# Patient Record
Sex: Female | Born: 1975 | Hispanic: No | Marital: Married | Smoking: Never smoker
Health system: Southern US, Community
[De-identification: ages and names within clinical notes are randomized; demographics above are authoritative.]

## PROBLEM LIST (undated history)

## (undated) ENCOUNTER — Inpatient Hospital Stay (HOSPITAL_COMMUNITY): Payer: Self-pay

## (undated) DIAGNOSIS — Z789 Other specified health status: Secondary | ICD-10-CM

---

## 2009-09-03 ENCOUNTER — Inpatient Hospital Stay (HOSPITAL_COMMUNITY): Admission: AD | Admit: 2009-09-03 | Discharge: 2009-09-06 | Payer: Self-pay | Admitting: Obstetrics

## 2009-09-15 ENCOUNTER — Observation Stay (HOSPITAL_COMMUNITY): Admission: AD | Admit: 2009-09-15 | Discharge: 2009-09-17 | Payer: Self-pay | Admitting: Obstetrics

## 2009-09-19 ENCOUNTER — Ambulatory Visit: Payer: Self-pay | Admitting: Advanced Practice Midwife

## 2009-09-19 ENCOUNTER — Inpatient Hospital Stay (HOSPITAL_COMMUNITY): Admission: AD | Admit: 2009-09-19 | Discharge: 2009-09-30 | Payer: Self-pay | Admitting: Obstetrics

## 2009-09-27 ENCOUNTER — Encounter (INDEPENDENT_AMBULATORY_CARE_PROVIDER_SITE_OTHER): Payer: Self-pay | Admitting: Obstetrics

## 2010-10-29 LAB — CROSSMATCH: ABO/RH(D): O POS

## 2010-10-29 LAB — COMPREHENSIVE METABOLIC PANEL
Albumin: 2.3 g/dL — ABNORMAL LOW (ref 3.5–5.2)
Alkaline Phosphatase: 126 U/L — ABNORMAL HIGH (ref 39–117)
BUN: 6 mg/dL (ref 6–23)
Chloride: 106 mEq/L (ref 96–112)
Creatinine, Ser: 0.63 mg/dL (ref 0.4–1.2)
GFR calc Af Amer: >60 mL/min (ref >60)
GFR calc non Af Amer: >60 mL/min (ref >60)
Total Protein: 5.5 g/dL — ABNORMAL LOW (ref 6.0–8.3)

## 2010-10-29 LAB — CBC
MCHC: 33.3 g/dL (ref 30.0–36.0)
RDW: 13.2 % (ref 11.5–15.5)

## 2010-10-29 LAB — FIBRINOGEN: Fibrinogen: 412 mg/dL (ref 204–475)

## 2010-10-29 LAB — D-DIMER, QUANTITATIVE: D-Dimer, Quant: 1.12 ug/mL-FEU — ABNORMAL HIGH (ref 0.00–0.48)

## 2010-10-29 LAB — APTT: aPTT: 30 seconds (ref 24–37)

## 2010-11-01 LAB — CBC
HCT: 21.4 % — ABNORMAL LOW (ref 36.0–46.0)
HCT: 28 % — ABNORMAL LOW (ref 36.0–46.0)
HCT: 29.8 % — ABNORMAL LOW (ref 36.0–46.0)
HCT: 30.5 % — ABNORMAL LOW (ref 36.0–46.0)
Hemoglobin: 10.2 g/dL — ABNORMAL LOW (ref 12.0–15.0)
Hemoglobin: 10.7 g/dL — ABNORMAL LOW (ref 12.0–15.0)
Hemoglobin: 7.2 g/dL — ABNORMAL LOW (ref 12.0–15.0)
Hemoglobin: 9.2 g/dL — ABNORMAL LOW (ref 12.0–15.0)
Hemoglobin: 9.6 g/dL — ABNORMAL LOW (ref 12.0–15.0)
MCHC: 33 g/dL (ref 30.0–36.0)
MCHC: 33 g/dL (ref 30.0–36.0)
MCHC: 33.5 g/dL (ref 30.0–36.0)
MCHC: 33.5 g/dL (ref 30.0–36.0)
MCHC: 33.6 g/dL (ref 30.0–36.0)
MCV: 96.3 fL (ref 78.0–100.0)
MCV: 96.9 fL (ref 78.0–100.0)
MCV: 99.3 fL (ref 78.0–100.0)
Platelets: 215 10*3/uL (ref 150–400)
Platelets: 236 10*3/uL (ref 150–400)
Platelets: 276 10*3/uL (ref 150–400)
RBC: 2.19 MIL/uL — ABNORMAL LOW (ref 3.87–5.11)
RBC: 2.82 MIL/uL — ABNORMAL LOW (ref 3.87–5.11)
RBC: 3.17 MIL/uL — ABNORMAL LOW (ref 3.87–5.11)
RBC: 3.3 MIL/uL — ABNORMAL LOW (ref 3.87–5.11)
RDW: 13.7 % (ref 11.5–15.5)
RDW: 15.4 % (ref 11.5–15.5)
RDW: 15.5 % (ref 11.5–15.5)
RDW: 15.8 % — ABNORMAL HIGH (ref 11.5–15.5)
RDW: 15.8 % — ABNORMAL HIGH (ref 11.5–15.5)
RDW: 15.8 % — ABNORMAL HIGH (ref 11.5–15.5)
WBC: 11.1 10*3/uL — ABNORMAL HIGH (ref 4.0–10.5)

## 2010-11-01 LAB — STREP B DNA PROBE: Strep Group B Ag: NEGATIVE

## 2010-11-01 LAB — APTT: aPTT: 28 seconds (ref 24–37)

## 2010-11-01 LAB — CROSSMATCH: Antibody Screen: NEGATIVE

## 2010-11-01 LAB — RAPID HIV SCREEN (WH-MAU): Rapid HIV Screen: NONREACTIVE

## 2010-11-01 LAB — TYPE AND SCREEN

## 2010-11-01 LAB — PROTIME-INR: INR: 1.02 (ref 0.00–1.49)

## 2010-11-21 ENCOUNTER — Other Ambulatory Visit (HOSPITAL_COMMUNITY): Payer: Self-pay | Admitting: Obstetrics

## 2010-11-21 ENCOUNTER — Ambulatory Visit (HOSPITAL_COMMUNITY)
Admission: RE | Admit: 2010-11-21 | Discharge: 2010-11-21 | Disposition: A | Discharge status: Home / Self Care | Payer: 59 | Source: Ambulatory Visit | Attending: Obstetrics | Admitting: Obstetrics

## 2010-11-21 DIAGNOSIS — R7611 Nonspecific reaction to tuberculin skin test without active tuberculosis: Secondary | ICD-10-CM | POA: Insufficient documentation

## 2011-11-30 ENCOUNTER — Ambulatory Visit (INDEPENDENT_AMBULATORY_CARE_PROVIDER_SITE_OTHER): Payer: 59 | Admitting: Family Medicine

## 2011-11-30 VITALS — BP 120/78 | HR 79 | Temp 98.2°F | Resp 16 | Ht 62.75 in | Wt 159.6 lb

## 2011-11-30 DIAGNOSIS — J302 Other seasonal allergic rhinitis: Secondary | ICD-10-CM

## 2011-11-30 DIAGNOSIS — J309 Allergic rhinitis, unspecified: Secondary | ICD-10-CM

## 2011-11-30 MED ORDER — MOMETASONE FUROATE 50 MCG/ACT NA SUSP: 2.0000 | Freq: Every day | NASAL | Status: DC

## 2011-11-30 MED ORDER — KETOTIFEN FUMARATE 0.025 % OP SOLN: 1.0000 [drp] | Freq: Two times a day (BID) | OPHTHALMIC | Status: AC

## 2011-11-30 NOTE — Progress Notes (Signed)
  [facility]:  Office Visit  Chief Complaint:  Chief Complaint  Patient presents with  . Sore Throat    itchy watery eyes sinus congestion x 3 days  . Cough    HPI: Katherine Day is a 35 y.o. female who complains of  3 day h/o of worsening allergy sxs. Red eyes, itchy eyes, sinus congestion. No fevers, chills. Tried OTc meds without relief.   History reviewed. No pertinent past medical history. Past Surgical History  Procedure Date  . Cesarean section    History   Social History  . Marital Status: Married    Spouse Name: N/A    Number of Children: N/A  . Years of Education: N/A   Social History Main Topics  . Smoking status: Never Smoker   . Smokeless tobacco: None  . Alcohol Use: No  . Drug Use: No  . Sexually Active: None   Other Topics Concern  . None   Social History Narrative  . None   No family history on file. No Known Allergies Prior to Admission medications   Not on File     ROS: The patient denies fevers, chills, night sweats, unintentional weight loss, chest pain, palpitations, wheezing, dyspnea on exertion, nausea, vomiting, abdominal pain, dysuria, hematuria, melena, numbness, weakness, or tingling. + itchy eyes, sinus congestion  All other systems have been reviewed and were otherwise negative with the exception of those mentioned in the HPI and as above.    PHYSICAL EXAM: Filed Vitals:   11/30/11 1024  BP: 120/78  Pulse: 79  Temp: 98.2 F (36.8 C)  Resp: 16   Filed Vitals:   11/30/11 1024  Height: 5' 2.75" (1.594 m)  Weight: 159 lb 9.6 oz (72.394 kg)   Body mass index is 28.50 kg/(m^2).  General: Alert, no acute distress HEENT:  Normocephalic, atraumatic, oropharynx patent. Eyes red, TM nl, throat nl. NO sinus tenderness. NO evidence of cellulitis. EOMI, PERRLA, fundoscopic exam nl.  Cardiovascular:  Regular rate and rhythm, no rubs murmurs or gallops.  No Carotid bruits, radial pulse intact. No pedal edema.    Respiratory: Clear to auscultation bilaterally.  No wheezes, rales, or rhonchi.  No cyanosis, no use of accessory musculature GI: No organomegaly, abdomen is soft and non-tender, positive bowel sounds.  No masses. Skin: No rashes. Neurologic: Facial musculature symmetric. Psychiatric: Patient is appropriate throughout our interaction. Lymphatic: No cervical lymphadenopathy Musculoskeletal: Gait intact.   LABS:    EKG/XRAY:   Primary read interpreted by Dr. Conley Rolls at [facility].   ASSESSMENT/PLAN: Encounter Diagnoses  Name Primary?  . Seasonal allergies Yes  . Allergic rhinitis    Flonase Anithistamine/Mast cell eye drops x 1 week C/w OTC Loratidine    Katherine Brisbon PHUONG, DO 11/30/2011 11:08 AM

## 2011-12-06 ENCOUNTER — Ambulatory Visit (INDEPENDENT_AMBULATORY_CARE_PROVIDER_SITE_OTHER): Payer: 59 | Admitting: Family Medicine

## 2011-12-06 DIAGNOSIS — H1010 Acute atopic conjunctivitis, unspecified eye: Secondary | ICD-10-CM

## 2011-12-06 DIAGNOSIS — J309 Allergic rhinitis, unspecified: Secondary | ICD-10-CM

## 2011-12-06 DIAGNOSIS — H1045 Other chronic allergic conjunctivitis: Secondary | ICD-10-CM

## 2011-12-06 MED ORDER — AZELASTINE HCL 0.1 % NA SOLN: 1.0000 | Freq: Two times a day (BID) | NASAL | Status: DC

## 2011-12-06 MED ORDER — METHYLPREDNISOLONE ACETATE 80 MG/ML IJ SUSP
80.0000 mg | Freq: Once | INTRAMUSCULAR | Status: AC
  Administered 2011-12-06: 80 mg via INTRAMUSCULAR

## 2011-12-06 NOTE — Progress Notes (Signed)
  Subjective:    Patient ID: Katherine Day, female    DOB: 01-26-1976, 36 y.o.   MRN: [id number]  HPI  Patient presents with persistent allergy symptoms; see OV 4/20  Extremely itchy eyes; rubbing eyes Nasal discharge mucus is purulent mixed with blood Denies coughing; some chest tightness  Taking claritin.  Review of Systems     Objective:   Physical Exam  HENT:  Right Ear: Tympanic membrane is retracted.  Left Ear: Tympanic membrane is retracted.  Nose: Mucosal edema (very swollen inflamed nasal turbinates) present.  Eyes: Right conjunctiva is injected. Left conjunctiva is injected.  Cardiovascular: Normal rate and regular rhythm.   Pulmonary/Chest: Wheezes: course BS.        Assessment & Plan:   1. Allergic rhinitis  methylPREDNISolone acetate (DEPO-MEDROL) injection 80 mg, azelastine (ASTELIN) 137 MCG/SPRAY nasal spray  2. Conjunctivitis, allergic  methylPREDNISolone acetate (DEPO-MEDROL) injection 80 mg   OTC [place] opthalmic drops BID Continue Claritin and Nasonex

## 2012-06-08 ENCOUNTER — Ambulatory Visit (INDEPENDENT_AMBULATORY_CARE_PROVIDER_SITE_OTHER): Payer: 59 | Admitting: Emergency Medicine

## 2012-06-08 VITALS — BP 113/75 | HR 82 | Temp 98.3°F | Resp 16 | Ht 63.0 in | Wt 165.0 lb

## 2012-06-08 DIAGNOSIS — J209 Acute bronchitis, unspecified: Secondary | ICD-10-CM

## 2012-06-08 DIAGNOSIS — J018 Other acute sinusitis: Secondary | ICD-10-CM

## 2012-06-08 DIAGNOSIS — Z23 Encounter for immunization: Secondary | ICD-10-CM

## 2012-06-08 MED ORDER — HYDROCOD POLST-CHLORPHEN POLST 10-8 MG/5ML PO LQCR: 5.0000 mL | Freq: Two times a day (BID) | ORAL | Status: DC | PRN

## 2012-06-08 MED ORDER — AMOXICILLIN-POT CLAVULANATE 875-125 MG PO TABS: 1.0000 | ORAL_TABLET | Freq: Two times a day (BID) | ORAL | Status: DC

## 2012-06-08 MED ORDER — PSEUDOEPHEDRINE-GUAIFENESIN ER 60-600 MG PO TB12: 1.0000 | ORAL_TABLET | Freq: Two times a day (BID) | ORAL | Status: DC

## 2012-06-08 NOTE — Progress Notes (Signed)
[facility] [address], [place] [address] [phone]  Date:  06/08/2012   Name:  Katherine Day   DOB:  1975-12-31   MRN:  [id number]  PCP:  No primary provider on file.    Chief Complaint: URI   History of Present Illness:  Katherine Day is a 36 y.o. very pleasant female patient who presents with the following:  Ill 3 days ago with nasal congestion and drainage.  Purulent post nasal drainage. Cough productive purulent sputum.  Headache, pressure in maxillary sinuses and malaise and myalgias.  No nausea or vomiting. No stool change.  No wheezing or shortness of breath.  No improvement with OTC medication.   There is no problem list on file for this patient.   No past medical history on file.  Past Surgical History  Procedure Date  . Cesarean section     History  Substance Use Topics  . Smoking status: Never Smoker   . Smokeless tobacco: Not on file  . Alcohol Use: No    No family history on file.  No Known Allergies  Medication list has been reviewed and updated.  No current outpatient prescriptions on file prior to visit.    Review of Systems:  As per HPI, otherwise negative.    Physical Examination: Filed Vitals:   06/08/12 1037  BP: 113/75  Pulse: 82  Temp: 98.3 F (36.8 C)  Resp: 16   Filed Vitals:   06/08/12 1037  Height: 5\' 3"  (1.6 m)  Weight: 165 lb (74.844 kg)   Body mass index is 29.23 kg/(m^2). Ideal Body Weight: Weight in (lb) to have BMI = 25: 140.8   GEN: WDWN, NAD, Non-toxic, A & O x 3  No rash or sepsis.  No icterus or shortness of breath. HEENT: Atraumatic, Normocephalic. Neck supple. No masses, No LAD.  Oropharynx negative Ears and Nose: No external deformity.  TM negative.   CV: RRR, No M/G/R. No JVD. No thrill. No extra heart sounds. PULM: CTA B, no wheezes, crackles, rhonchi. No retractions. No resp. distress. No accessory muscle use. ABD: S, NT, ND, +BS. No rebound. No HSM. EXTR: No  c/c/e NEURO Normal gait.  PSYCH: Normally interactive. Conversant. Not depressed or anxious appearing.  Calm demeanor.    Assessment and Plan: Sinusitis Bronchitis augmentin mucinex d tussionex Follow up as needed  [name], MD

## 2012-06-15 NOTE — Progress Notes (Signed)
Reviewed and agree.

## 2012-07-03 ENCOUNTER — Ambulatory Visit (HOSPITAL_COMMUNITY)
Admission: RE | Admit: 2012-07-03 | Discharge: 2012-07-03 | Disposition: A | Discharge status: Home / Self Care | Payer: 59 | Source: Ambulatory Visit | Attending: Obstetrics | Admitting: Obstetrics

## 2012-07-03 ENCOUNTER — Other Ambulatory Visit (HOSPITAL_COMMUNITY): Payer: Self-pay | Admitting: Obstetrics

## 2012-07-03 DIAGNOSIS — Z9289 Personal history of other medical treatment: Secondary | ICD-10-CM

## 2012-07-03 DIAGNOSIS — R7611 Nonspecific reaction to tuberculin skin test without active tuberculosis: Secondary | ICD-10-CM | POA: Insufficient documentation

## 2012-07-14 ENCOUNTER — Other Ambulatory Visit: Payer: Self-pay | Admitting: Physician Assistant

## 2012-07-14 ENCOUNTER — Ambulatory Visit: Payer: 59

## 2012-07-14 ENCOUNTER — Telehealth: Payer: Self-pay | Admitting: Physician Assistant

## 2012-07-14 ENCOUNTER — Ambulatory Visit (INDEPENDENT_AMBULATORY_CARE_PROVIDER_SITE_OTHER): Payer: 59 | Admitting: Internal Medicine

## 2012-07-14 VITALS — BP 148/90 | HR 98 | Temp 99.1°F | Resp 18 | Ht 63.0 in | Wt 163.0 lb

## 2012-07-14 DIAGNOSIS — R070 Pain in throat: Secondary | ICD-10-CM

## 2012-07-14 DIAGNOSIS — E041 Nontoxic single thyroid nodule: Secondary | ICD-10-CM

## 2012-07-14 NOTE — Telephone Encounter (Signed)
Please call pt and let her know that the radiologist who over read her x-ray could not exclude a foreign body.  Given this information, we could go ahead and proceed with an ENT evaluation if she would like.  Or, we can continue to watch and wait, it is up to her.

## 2012-07-14 NOTE — Telephone Encounter (Signed)
Left message for patient to call back about the xray.

## 2012-07-14 NOTE — Patient Instructions (Addendum)
We were not able to see any foreign body on the x-ray or by looking in your throat.  It is possible that the bone scraped your throat but has already passed through.  Let us know how you are doing in 24-48 hours.  If you are not improving, we will refer you to ENT for further evaluation.  You may continue using Aleve for pain relief.  Eat soft foods and drink plenty of water.  If you are worsening - having difficulty swallowing, drooling, having difficulty breathing - let us know right away.   I am putting in an order for you to have a thyroid ultrasound to evaluate the nodule that we found.  You will get a phone call to schedule this.

## 2012-07-14 NOTE — Progress Notes (Signed)
  Subjective:    Patient ID: Katherine Day, female    DOB: 12/17/1975, 36 y.o.   MRN: [id number]  HPI   Katherine Day is a 36 yr old female complaining of throat pain.  States that she feels like there is something stuck in her throat.  She noticed this sensation yesterday.  It feels sharp "like a small bone".  States she did eat tilapia that had bones in it yesterday.  She is able to swallow without difficulty, just pain.  No drooling.  No difficulty breathing.  This has never happened before.  She has not eaten anything yet today.  This morning she took Aleve for pain relief and amoxicillin that she had left over from the last time she was sick.  The Aleve did relieve her pain.  She denies any associated symptoms   Review of Systems  Constitutional: Negative for fever and chills.  HENT: Positive for sore throat. Negative for ear pain, congestion, rhinorrhea, sneezing, drooling, trouble swallowing, voice change, postnasal drip and sinus pressure.   Respiratory: Negative.   Cardiovascular: Negative.   Gastrointestinal: Negative.   Musculoskeletal: Negative.   Skin: Negative.   Neurological: Negative.        Objective:   Physical Exam  Vitals reviewed. Constitutional: She is oriented to person, place, and time. She appears well-developed and well-nourished. No distress.  HENT:  Head: Normocephalic and atraumatic.  Mouth/Throat: Uvula is midline, oropharynx is clear and moist and mucous membranes are normal.       No foreign body visualized  Neck: Neck supple. No tracheal deviation present. Thyromegaly (nodule, right side) present.  Cardiovascular: Normal rate, regular rhythm and normal heart sounds.  Exam reveals no gallop and no friction rub.   No murmur heard. Pulmonary/Chest: Effort normal and breath sounds normal. No stridor. She has no wheezes. She has no rales.  Lymphadenopathy:    She has no cervical adenopathy.  Neurological: She is alert and oriented to person, place, and time.   Skin: Skin is warm and dry.  Psychiatric: She has a normal mood and affect. Her behavior is normal.      Filed Vitals:   07/14/12 0755  BP: 148/90  Pulse: 98  Temp: 99.1 F (37.3 C)  Resp: 18     [facility] reading (PRIMARY) by  Dr. Perrin Maltese - no foreign body visualized       Assessment & Plan:   1. Throat pain  DG Cervical Spine 2-3 Views  2. Thyroid nodule  US Soft Tissue Head/Neck    Katherine Day is a 36 yr old female with throat pain/foreign body sensation after eating fish.  No foreign body was visualized on exam or on plain film.  She is not in distress.  Her airway is patent.  It is possible that she sustained an injury from a fish bone but that the bone itself has passed through.  Discussed this with patient.  Patient would like to wait 24-48 hours before proceeding with ENT consult.  She will call us and let us know how she is doing.  Encouraged pt to use Aleve for pain relief and to eat soft foods.  Gave clear instructions to RTC if she develops difficulty swallowing, drooling, difficulty breathing, etc.  Pt voiced understanding.  I have ordered a thyroid ultrasound to evaluate a nodule that was noted on exam.

## 2012-07-17 NOTE — Telephone Encounter (Signed)
I have mailed copy of the report to her, and have asked her to call back if questions.

## 2012-11-23 ENCOUNTER — Ambulatory Visit (INDEPENDENT_AMBULATORY_CARE_PROVIDER_SITE_OTHER): Payer: 59 | Admitting: Physician Assistant

## 2012-11-23 ENCOUNTER — Telehealth: Payer: Self-pay | Admitting: Radiology

## 2012-11-23 VITALS — BP 118/76 | HR 81 | Temp 98.8°F | Resp 16 | Ht 62.5 in | Wt 161.0 lb

## 2012-11-23 DIAGNOSIS — H1013 Acute atopic conjunctivitis, bilateral: Secondary | ICD-10-CM

## 2012-11-23 DIAGNOSIS — H1045 Other chronic allergic conjunctivitis: Secondary | ICD-10-CM

## 2012-11-23 DIAGNOSIS — J302 Other seasonal allergic rhinitis: Secondary | ICD-10-CM

## 2012-11-23 DIAGNOSIS — J309 Allergic rhinitis, unspecified: Secondary | ICD-10-CM

## 2012-11-23 MED ORDER — IPRATROPIUM BROMIDE 0.03 % NA SOLN: 2.0000 | Freq: Two times a day (BID) | NASAL | Status: DC

## 2012-11-23 MED ORDER — AZELASTINE HCL 0.05 % OP SOLN: 1.0000 [drp] | Freq: Two times a day (BID) | OPHTHALMIC | Status: DC

## 2012-11-23 NOTE — Progress Notes (Signed)
Subjective:    Patient ID: Katherine Day, female    DOB: 1976-03-04, 37 y.o.   MRN: [id number]  HPI This 37 y.o. female presents for evaluation of allergies. Reports seasonal allergies this time every year.  Two visits from 11/2011 are reviewed. Yesterday developed itchy, watery eyes, runny, congested nose.  Claritin helps only for 3-4 hours. Noticed her eyes became red. No cough.  No sore throat.  History reviewed. No pertinent past medical history.  Past Surgical History  Procedure Laterality Date  . Cesarean section  2009, 2011    x2    Prior to Admission medications   Medication Sig Start Date End Date Taking? Authorizing Provider  loratadine (CLARITIN) 10 MG tablet Take 10 mg by mouth daily.   Yes Historical Provider, MD    No Known Allergies  History   Social History  . Marital Status: Married    Spouse Name: N/A    Number of Children: 3  . Years of Education: 12+   Occupational History  . CNA     [employer]   Social History Main Topics  . Smoking status: Never Smoker   . Smokeless tobacco: Never Used  . Alcohol Use: No  . Drug Use: No  . Sexually Active: Not on file   Other Topics Concern  . Not on file   Social History Narrative   From [place], [place].  Came to the [place] in 2010.  Lives with her husband and their 3 children.    History reviewed. No pertinent family history.   Review of Systems As above. No chest pain, SOB, HA, dizziness, vision change, N/V, diarrhea, constipation, dysuria, urinary urgency or frequency, myalgias, arthralgias or rash.     Objective:   Physical Exam  Blood pressure 118/76, pulse 81, temperature 98.8 F (37.1 C), temperature source Oral, resp. rate 16, height 5' 2.5" (1.588 m), weight 161 lb (73.029 kg), last menstrual period 11/06/2012, SpO2 99.00%. Body mass index is 28.96 kg/(m^2). Well-developed, well nourished BF who is awake, alert and oriented, in NAD. HEENT: [place]/AT, PERRL, EOMI.  Sclera and  conjunctiva are injected.  EAC are patent, TMs are normal in appearance. Nasal mucosa is congested and very pink and moist. OP is clear. Neck: supple, non-tender, no lymphadenopathy, thyromegaly. Heart: RRR, no murmur Lungs: normal effort, CTA Extremities: no cyanosis, clubbing or edema. Skin: warm and dry without rash. Psychologic: good mood and appropriate affect, normal speech and behavior.       Assessment & Plan:  Conjunctivitis, allergic, bilateral - Plan: azelastine (OPTIVAR) 0.05 % ophthalmic solution  Seasonal allergies - Plan: ipratropium (ATROVENT) 0.03 % nasal spray; she notes that she doesn't like to use nasal sprays, and wants pills.  She's had oral and IM steroids for this previously.  I've advised her that steroid nasal sprays are the best treatment for allergies.  We elect to try Atrovent today, as it will likely afford her relief faster than a steroid spray.  If her symptoms persist, consider oral prednisone, and consider Singulair as a long term agent.  Patient Instructions  Continue to use the Claritin one time each day.  You can try Zyrtec or Allegra, to see if they provide more benefit for a longer period of time (they are one pill each day as well) You can take diphenhydramine (Benadryl) at bedtime, too, if needed. Use the nasal spray when you are congested.  If you are not congested, you don't have to use it. Drink at least  64 ounces of water each day.     Fernande Bras, PA-C Physician Assistant-Certified [facility]

## 2012-11-23 NOTE — Telephone Encounter (Signed)
I spoke with the pharmacy-her insurance doesn't cover the azelastine at all. The pharmacist reccommeds an over-the-counter drop, like ZADITOR.

## 2012-11-23 NOTE — Telephone Encounter (Signed)
Patient states drops too expensive at pharmacy, can you advise on more cost effective choice?

## 2012-11-23 NOTE — Telephone Encounter (Signed)
Pt just called from [facility], not easy to understand. The medicine for her eyes is too expensive ($85.00) and she would like something else called in to [facility]. I advised pt not to wait at [facility] but I am not sure she understood  Pt [phone]

## 2012-11-23 NOTE — Patient Instructions (Signed)
Continue to use the Claritin one time each day.  You can try Zyrtec or Allegra, to see if they provide more benefit for a longer period of time (they are one pill each day as well) You can take diphenhydramine (Benadryl) at bedtime, too, if needed. Use the nasal spray when you are congested.  If you are not congested, you don't have to use it. Drink at least 64 ounces of water each day.

## 2012-11-24 NOTE — Telephone Encounter (Signed)
She said she does not need the prescription any more.

## 2013-11-23 ENCOUNTER — Other Ambulatory Visit (HOSPITAL_COMMUNITY): Payer: Self-pay | Admitting: Obstetrics

## 2013-11-23 DIAGNOSIS — Z1231 Encounter for screening mammogram for malignant neoplasm of breast: Secondary | ICD-10-CM

## 2013-11-24 ENCOUNTER — Ambulatory Visit (INDEPENDENT_AMBULATORY_CARE_PROVIDER_SITE_OTHER): Payer: 59 | Admitting: Family Medicine

## 2013-11-24 ENCOUNTER — Ambulatory Visit (HOSPITAL_COMMUNITY): Payer: 59

## 2013-11-24 VITALS — BP 118/72 | HR 83 | Temp 98.3°F | Resp 16 | Ht 63.0 in | Wt 174.6 lb

## 2013-11-24 DIAGNOSIS — J309 Allergic rhinitis, unspecified: Secondary | ICD-10-CM

## 2013-11-24 DIAGNOSIS — M7710 Lateral epicondylitis, unspecified elbow: Secondary | ICD-10-CM

## 2013-11-24 DIAGNOSIS — J302 Other seasonal allergic rhinitis: Secondary | ICD-10-CM

## 2013-11-24 MED ORDER — METHYLPREDNISOLONE ACETATE 80 MG/ML IJ SUSP
80.0000 mg | Freq: Once | INTRAMUSCULAR | Status: AC
  Administered 2013-11-24: 80 mg via INTRAMUSCULAR

## 2013-11-24 MED ORDER — IPRATROPIUM BROMIDE 0.03 % NA SOLN: 2.0000 | Freq: Two times a day (BID) | NASAL | Status: DC

## 2013-11-24 NOTE — Patient Instructions (Signed)
For your allergies, buy either claritin or zyrtec OTC (generic is fine) and take one a day.  You can also take benadryl at night before bed.  Start back on your atrovent nasal spray- use it up to 4x a day  For your eyes, I would buy some genteal eye moisturizer gel- this will help with itching and is cheaper than an rx  You likely have a mild tennis elbow in your left elbow- try to avoid movements that make you elbow hurt more, and avoid lifting heavy weights.  Let me know if this is getting worse.  You can also purchase an OTC tennis elbow strap to wear as needed

## 2013-11-24 NOTE — Progress Notes (Signed)
[facility] [address], [place] [address] [phone]  Date:  11/24/2013   Name:  Katherine Day   DOB:  [birth year]   MRN:  [id number]  PCP:  No PCP Per Patient    Chief Complaint: ears itching, nasal congestion and sneezing   History of Present Illness:  Katherine Day is a 38 y.o. very pleasant female patient who presents with the following:  She is generally healthy.  She is here today for likely allergies- she notes nasal congestion, sneezing, ears itching.   No cough.  No fever, chills or aches.  She sometimes has a runny nose.   She has noted a red and sore throat.   She has received depo- medrol for allergies in the past and would like to have the same today  No GI symptoms.   Her eyes are itchy as well  LMP about 2 weeks ago  She also works as a [employer]ar[employer] manager, and notes some pain in her left elbow for 6- 8 months.  She has not pursued a WC claim for this problem.   She is not aware of any acute injury.    Patient Active Problem List   Diagnosis Date Noted  . Seasonal allergies 11/23/2012    History reviewed. No pertinent past medical history.  Past Surgical History  Procedure Laterality Date  . Cesarean section  2009, 2011    x2    History  Substance Use Topics  . Smoking status: Never Smoker   . Smokeless tobacco: Never Used  . Alcohol Use: No    History reviewed. No pertinent family history.  No Known Allergies  Medication list has been reviewed and updated.  Current Outpatient Prescriptions on File Prior to Visit  Medication Sig Dispense Refill  . azelastine (OPTIVAR) 0.05 % ophthalmic solution Place 1 drop into both eyes 2 (two) times daily.  6 mL  1  . ipratropium (ATROVENT) 0.03 % nasal spray Place 2 sprays into the nose 2 (two) times daily.  30 mL  0  . loratadine (CLARITIN) 10 MG tablet Take 10 mg by mouth daily.       No current facility-administered medications on file prior to visit.    Review of Systems:  As per  HPI- otherwise negative.   Physical Examination: Filed Vitals:   11/24/13 0754  BP: 118/72  Pulse: 83  Temp: 98.3 F (36.8 C)  Resp: 16   Filed Vitals:   11/24/13 0754  Height: 5\' 3"  (1.6 m)  Weight: 174 lb 9.6 oz (79.198 kg)   Body mass index is 30.94 kg/(m^2). Ideal Body Weight: Weight in (lb) to have BMI = 25: 140.8  GEN: WDWN, NAD, Non-toxic, A & O x 3, overweight, looks well HEENT: Atraumatic, Normocephalic. Neck supple. No masses, No LAD.  Bilateral TM wnl, oropharynx normal.  PEERL,EOMI.   Nasal cavity is congested, bilateral eyes are irritated and bloodshot.  Normal fundoscopic exam Ears and Nose: No external deformity. CV: RRR, No M/G/R. No JVD. No thrill. No extra heart sounds. PULM: CTA B, no wheezes, crackles, rhonchi. No retractions. No resp. distress. No accessory muscle use. EXTR: No c/c/e NEURO Normal gait.  PSYCH: Normally interactive. Conversant. Not depressed or anxious appearing.  Calm demeanor.  Left elbow: normal ROM to flexion/ extension/ supination and pronation.  Slightly tender over the lateral epicondyle.  Normal strength   She would like a shot of depo- medrol today, as she had in 2013 with good results.  Will give to her.    Assessment and Plan: Seasonal allergies - Plan: ipratropium (ATROVENT) 0.03 % nasal spray, methylPREDNISolone acetate (DEPO-MEDROL) injection 80 mg  Lateral epicondylitis  Treat for recurrent seasonal allergies with medications as per instructions and depo- medrol IM as per her request.  She has used this in the past with good results.    See patient instructions for more details.     Signed Lamar Blinks, MD

## 2013-12-16 ENCOUNTER — Telehealth: Payer: Self-pay

## 2013-12-16 NOTE — Telephone Encounter (Signed)
Pt states she has lost her rx for allergies that was given and would like it re done  Best number [phone]

## 2013-12-16 NOTE — Telephone Encounter (Signed)
Advised pt to go to pharmacy to get rx's.  She never went to go get them.

## 2013-12-17 ENCOUNTER — Telehealth: Payer: Self-pay | Admitting: *Deleted

## 2013-12-17 MED ORDER — FLUTICASONE PROPIONATE 50 MCG/ACT NA SUSP: 2.0000 | Freq: Every day | NASAL | Status: DC

## 2013-12-17 NOTE — Telephone Encounter (Signed)
Pt has been using Claritin and Atrovent since her last visit. She states it is not working and would like something else called into her pharmacy for her. She states her allergies are worse right now then they have ever been. Can we call in something else?   Call Back [phone]

## 2013-12-17 NOTE — Telephone Encounter (Signed)
I have sent a Rx for Flonase to the pharmacy.  I might have the patient try another OTC med like zyrtec to see if she gets additional relief.

## 2013-12-17 NOTE — Telephone Encounter (Signed)
Spoke to patient advised her that flonase was sent to pharmacy and that if she did not get enough relief from that she could try adding zyrtec.  She said that she does not like zyrtec she likes claritin.  She was very appreciative for the flonase.

## 2014-04-14 ENCOUNTER — Ambulatory Visit (INDEPENDENT_AMBULATORY_CARE_PROVIDER_SITE_OTHER): Payer: 59 | Admitting: Physician Assistant

## 2014-04-14 VITALS — BP 132/76 | HR 93 | Temp 98.0°F | Resp 18 | Ht 63.0 in | Wt 170.6 lb

## 2014-04-14 DIAGNOSIS — Z111 Encounter for screening for respiratory tuberculosis: Secondary | ICD-10-CM

## 2014-04-14 DIAGNOSIS — Z23 Encounter for immunization: Secondary | ICD-10-CM

## 2014-04-14 NOTE — Progress Notes (Signed)
   Subjective:    Patient ID: Katherine Day, female    DOB: November 23, 1975, 38 y.o.   MRN: [id number]  HPI Pt presents to clinic for her yearly PPD screening.  She has had BCG and has a + PPD in 06/2012 with a neg chest xray.  She has no current TB symptoms from the [facility] questionnaire. She would also like her Flu vaccine this year.  Review of Systems     Objective:   Physical Exam  Vitals reviewed. Constitutional: She appears well-developed and well-nourished.  Pulmonary/Chest: Effort normal.  Psychiatric: She has a normal mood and affect. Her behavior is normal. Judgment and thought content normal.     Assessment & Plan:  Screening-pulmonary TB  Flu vaccine need - Plan: Flu Vaccine QUAD 36+ mos IM  Letter with a copy of her CXR from 06/2012 included to document her inability to get a PPD placed.  She has no current symptoms of TB.  Windell Hummingbird PA-C  [facility] 04/14/2014 4:04 PM

## 2014-04-14 NOTE — Progress Notes (Signed)
  Tuberculosis Risk Questionnaire  1. Yes [place] Were you born outside the [place] in one of the following parts of the world: [place], [place], [place], [place] or [place]?    2. Yes lived in [place] until she was 24. Have you traveled outside the [place] and lived for more than one month in one of the following parts of the world: [place], [place], [place], [place] or [place]?    3. No Do you have a compromised immune system such as from any of the following conditions:HIV/AIDS, organ or bone marrow transplantation, diabetes, immunosuppressive medicines (e.g. Prednisone, Remicaide), leukemia, lymphoma, cancer of the head or neck, gastrectomy or jejunal bypass, end-stage renal disease (on dialysis), or silicosis?     4. Yes Residential care center. Have you ever or do you plan on working in: a residential care center, a health care facility, a jail or prison or homeless shelter?    5. No Have you ever: injected illegal drugs, used crack cocaine, lived in a homeless shelter  or been in jail or prison?     6. No Have you ever been exposed to anyone with infectious tuberculosis?    Tuberculosis Symptom Questionnaire  Do you currently have any of the following symptoms?  1. No Unexplained cough lasting more than 3 weeks?   2. No Unexplained fever lasting more than 3 weeks.   3. No Night Sweats (sweating that leaves the bedclothes and sheets wet)     4. No Shortness of Breath   5. No Chest Pain   6. No Unintentional weight loss    7. No Unexplained fatigue (very tired for no reason)

## 2014-05-24 ENCOUNTER — Encounter: Payer: Self-pay | Admitting: Radiology

## 2014-05-24 ENCOUNTER — Ambulatory Visit (INDEPENDENT_AMBULATORY_CARE_PROVIDER_SITE_OTHER): Payer: 59 | Admitting: Internal Medicine

## 2014-05-24 VITALS — BP 122/80 | HR 80 | Temp 99.3°F | Resp 16 | Ht 62.5 in | Wt 171.2 lb

## 2014-05-24 DIAGNOSIS — N926 Irregular menstruation, unspecified: Secondary | ICD-10-CM

## 2014-05-24 DIAGNOSIS — E040 Nontoxic diffuse goiter: Secondary | ICD-10-CM

## 2014-05-24 DIAGNOSIS — E012 Iodine-deficiency related (endemic) goiter, unspecified: Secondary | ICD-10-CM

## 2014-05-24 DIAGNOSIS — R5382 Chronic fatigue, unspecified: Secondary | ICD-10-CM

## 2014-05-24 DIAGNOSIS — D72819 Decreased white blood cell count, unspecified: Secondary | ICD-10-CM

## 2014-05-24 LAB — POCT URINALYSIS DIPSTICK
Bilirubin, UA: NEGATIVE
GLUCOSE UA: NEGATIVE
Ketones, UA: NEGATIVE
LEUKOCYTES UA: NEGATIVE
Nitrite, UA: NEGATIVE
Protein, UA: NEGATIVE
SPEC GRAV UA: 1.015
UROBILINOGEN UA: 0.2
pH, UA: 8.5

## 2014-05-24 LAB — POCT CBC
Granulocyte percent: 43.1 %G (ref 37–80)
HCT, POC: 41.8 % (ref 37.7–47.9)
HEMOGLOBIN: 13.6 g/dL (ref 12.2–16.2)
Lymph, poc: 2.2 (ref 0.6–3.4)
MCH: 30.9 pg (ref 27–31.2)
MCHC: 32.5 g/dL (ref 31.8–35.4)
MCV: 95.1 fL (ref 80–97)
MID (cbc): 0.4 (ref 0–0.9)
MPV: 6.7 fL (ref 0–99.8)
POC Granulocyte: 1.9 — AB (ref 2–6.9)
POC LYMPH PERCENT: 48 %L (ref 10–50)
POC MID %: 8.9 % (ref 0–12)
Platelet Count, POC: 293 10*3/uL (ref 142–424)
RBC: 4.4 M/uL (ref 4.04–5.48)
RDW, POC: 12 %
WBC: 4.5 10*3/uL — AB (ref 4.6–10.2)

## 2014-05-24 LAB — COMPREHENSIVE METABOLIC PANEL
ALBUMIN: 4.2 g/dL (ref 3.5–5.2)
ALT: 17 U/L (ref 0–35)
AST: 17 U/L (ref 0–37)
Alkaline Phosphatase: 76 U/L (ref 39–117)
BUN: 11 mg/dL (ref 6–23)
CALCIUM: 9.4 mg/dL (ref 8.4–10.5)
CHLORIDE: 103 meq/L (ref 96–112)
CO2: 29 meq/L (ref 19–32)
CREATININE: 0.73 mg/dL (ref 0.50–1.10)
Glucose, Bld: 92 mg/dL (ref 70–99)
POTASSIUM: 4.3 meq/L (ref 3.5–5.3)
SODIUM: 139 meq/L (ref 135–145)
TOTAL PROTEIN: 7.4 g/dL (ref 6.0–8.3)
Total Bilirubin: 0.8 mg/dL (ref 0.2–1.2)

## 2014-05-24 LAB — POCT UA - MICROSCOPIC ONLY
Casts, Ur, LPF, POC: NEGATIVE
Crystals, Ur, HPF, POC: NEGATIVE
Mucus, UA: NEGATIVE
Yeast, UA: NEGATIVE

## 2014-05-24 LAB — T4, FREE: Free T4: 1 ng/dL (ref 0.80–1.80)

## 2014-05-24 LAB — GLUCOSE, POCT (MANUAL RESULT ENTRY): POC Glucose: 103 mg/dl — AB (ref 70–99)

## 2014-05-24 LAB — TSH: TSH: 0.925 u[IU]/mL (ref 0.350–4.500)

## 2014-05-24 LAB — HIV ANTIBODY (ROUTINE TESTING W REFLEX): HIV 1&2 Ab, 4th Generation: NONREACTIVE

## 2014-05-24 LAB — POCT URINE PREGNANCY: PREG TEST UR: NEGATIVE

## 2014-05-24 NOTE — Patient Instructions (Signed)

## 2014-05-24 NOTE — Progress Notes (Signed)
Subjective:    Patient ID: Katherine Day, female    DOB: 1976-02-14, 38 y.o.   MRN: [id number]  HPI A bit of language barrier, no sob or chest or cough. No uri sxs. CO fatigue, no gi or gu sxs.Does have change in menses, no heavy bleeding.   Review of Systems     Objective:   Physical Exam  Vitals reviewed. Constitutional: She is oriented to person, place, and time. She appears well-developed and well-nourished. No distress.  HENT:  Head: Normocephalic.  Nose: Nose normal.  Mouth/Throat: Oropharynx is clear and moist.  Eyes: EOM are normal. Pupils are equal, round, and reactive to light.  Neck: Normal range of motion. Neck supple. Thyromegaly present.  Cardiovascular: Normal rate, regular rhythm and normal heart sounds.   Pulmonary/Chest: Effort normal and breath sounds normal.  Abdominal: Soft. Bowel sounds are normal. There is no tenderness.  Musculoskeletal: Normal range of motion.  Lymphadenopathy:    She has no cervical adenopathy.  Neurological: She is alert and oriented to person, place, and time. She exhibits normal muscle tone. Coordination normal.  Psychiatric: She has a normal mood and affect. Her behavior is normal. Judgment and thought content normal.   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  Results for orders placed in visit on 05/24/14  POCT CBC      Result Value Ref Range   WBC 4.5 (*) 4.6 - 10.2 K/uL   Lymph, poc 2.2  0.6 - 3.4   POC LYMPH PERCENT 48.0  10 - 50 %L   MID (cbc) 0.4  0 - 0.9   POC MID % 8.9  0 - 12 %M   POC Granulocyte 1.9 (*) 2 - 6.9   Granulocyte percent 43.1  37 - 80 %G   RBC 4.40  4.04 - 5.48 M/uL   Hemoglobin 13.6  12.2 - 16.2 g/dL   HCT, POC 41.8  37.7 - 47.9 %   MCV 95.1  80 - 97 fL   MCH, POC 30.9  27 - 31.2 pg   MCHC 32.5  31.8 - 35.4 g/dL   RDW, POC 12.0     Platelet Count, POC 293  142 - 424 K/uL   MPV 6.7  0 - 99.8 fL  GLUCOSE, POCT (MANUAL RESULT ENTRY)      Result Value Ref Range   POC Glucose 103 (*) 70 - 99  mg/dl  POCT URINALYSIS DIPSTICK      Result Value Ref Range   Color, UA yellow     Clarity, UA slightly cloudy     Glucose, UA neg     Bilirubin, UA neg     Ketones, UA neg     Spec Grav, UA 1.015     Blood, UA trace-intact     pH, UA 8.5     Protein, UA neg     Urobilinogen, UA 0.2     Nitrite, UA neg     Leukocytes, UA Negative    POCT UA - MICROSCOPIC ONLY      Result Value Ref Range   WBC, Ur, HPF, POC 0-1     RBC, urine, microscopic 0-1     Bacteria, U Microscopic 3+     Mucus, UA neg     Epithelial cells, urine per micros 1-2     Crystals, Ur, HPF, POC neg     Casts, Ur, LPF, POC neg     Yeast, UA neg    POCT URINE PREGNANCY  Result Value Ref Range   Preg Test, Ur Negative      Lab pending      Assessment & Plan:  Fatigue/Cause unclear Right lobe goiter Vitamins started

## 2014-05-27 ENCOUNTER — Encounter: Payer: Self-pay | Admitting: Radiology

## 2014-10-17 ENCOUNTER — Ambulatory Visit (INDEPENDENT_AMBULATORY_CARE_PROVIDER_SITE_OTHER): Payer: PRIVATE HEALTH INSURANCE | Admitting: Family Medicine

## 2014-10-17 VITALS — BP 128/74 | HR 91 | Temp 98.5°F | Resp 18 | Ht 63.0 in | Wt 177.0 lb

## 2014-10-17 DIAGNOSIS — R079 Chest pain, unspecified: Secondary | ICD-10-CM

## 2014-10-17 DIAGNOSIS — R1084 Generalized abdominal pain: Secondary | ICD-10-CM

## 2014-10-17 NOTE — Progress Notes (Signed)
Subjective:    Patient ID: Katherine Day, female    DOB: 1975/09/17, 39 y.o.   MRN: [id number]  Chief Complaint  Patient presents with  . Chest Pain    x 1 month  . Abdominal Pain   Patient Active Problem List   Diagnosis Date Noted  . Seasonal allergies 11/23/2012   Prior to Admission medications   Not on File   Medications, allergies, past medical history, surgical history, family history, social history and problem list reviewed and updated.  HPI  39 yof with no pertinent pmh presents with one month h/o cp and abd pain.   CP - started after taking her bp at work which was elevated to 146/100. At that time she started having left sided cp around her left breast. This persisted for few mins then resolved on own. Moderate pain. She has had intermittent returns of these cp episodes over past month. Maybe 15 total episodes over past month. Episodes last approx 5 mins and resolve on own. She is always sitting when the cp episodes happen. They resolve with relaxation and deep breathing. Denies any exertional cp. CP does not radiate. Denies presyncope or syncope. Denies palps. PERC negative.   She does feel that her heart pounds when she has the cp episodes, she has assoc sob with the episodes, and feels as if she's trembling when they happen. When asked she thinks that she may have some anxiety with these cp episodes. Denies alcohol, illicit drug, or excess caffeine intake. Denies feeling anxious most days of week. Denies feeling depressed or having loss of interest.   Abd pain - this is not actually pain. She mentions that 3-4 times over the past month she has felt like something is moving in her stomach. Mostly over the left side. These episodes last 2-3 mins. Last one was one week ago. Denies fever, chills, n/v, diarrhea. Has normal bm daily. Periods are regular, not heavy. Denies gerd sx. Denies actual abd pain. She had a vertical c-section in [place] 5 yrs ago and states she was told  she has fibroids. She would like to see a gyn in the area.   Review of Systems See HPI.     Objective:   Physical Exam  Constitutional: She is oriented to person, place, and time. She appears well-developed and well-nourished.  Non-toxic appearance. She does not have a sickly appearance. She does not appear ill. No distress.  BP 128/74 mmHg  Pulse 91  Temp(Src) 98.5 F (36.9 C)  Resp 18  Ht 5\' 3"  (1.6 m)  Wt 177 lb (80.287 kg)  BMI 31.36 kg/m2  SpO2 99%  LMP 10/02/2014   Eyes: Conjunctivae and EOM are normal. Pupils are equal, round, and reactive to light.  Neck: No hepatojugular reflux and no JVD present.  Cardiovascular: Normal rate, regular rhythm and normal heart sounds.  Exam reveals no gallop.   No murmur heard. Pulmonary/Chest: Effort normal and breath sounds normal. No tachypnea. She has no decreased breath sounds. She has no wheezes. She has no rhonchi. She has no rales. She exhibits tenderness. She exhibits no bony tenderness. Left breast exhibits tenderness. Left breast exhibits no mass and no nipple discharge.    TTP over circled areas over left breast. No mass appreciated. No other chest wall ttp.   Abdominal: Soft. Normal appearance and bowel sounds are normal. There is no hepatosplenomegaly. There is no tenderness. There is no rigidity, no rebound, no guarding, no CVA tenderness, no tenderness at McBurney's  point and negative Murphy's sign.  Lymphadenopathy:    She has no cervical adenopathy.  Neurological: She is alert and oriented to person, place, and time.  Skin: Skin is warm and dry. She is not diaphoretic.  Psychiatric: She has a normal mood and affect. Her speech is normal and behavior is normal.   EKG read by Dr Brigitte Pulse. Findings: Normal     Assessment & Plan:   39 yof with no pertinent pmh presents with one month h/o cp and abd pain.   Chest pain, unspecified chest pain type - Plan: EKG 12-Lead --doubt cardiac involvement with no exertional cp, atypical  presentation, and normal ekg --perc neg --no gerd sx --could be related to panic attacks with assoc sx of heart pounding, sob, trembling --pt instructed on deep breathing, progressive relaxation --rtc if no improvement for possible therapy referral, medication trial --no panic disorder at this time  --ttp over left breast, gyn referral as below  Generalized abdominal pain - Plan: Ambulatory referral to Gynecology --benign exam, not actually having pain, more "sense of moving" in her abdomen sensation --unsure of etiology at this time, could be secondary to fibroids --gyn referral as per pt request --rtc if no relief  Julieta Gutting, PA-C Physician Assistant-Certified [facility]  10/17/2014 10:07 PM

## 2014-10-17 NOTE — Patient Instructions (Addendum)
Your EKG looked normal today. I don't think the chest pain is coming from your heart or lungs. It may be being caused by panic attacks. Hopefully these will decrease as you continue to do deep breathing, and work on relaxation. If these attacks persist please come back to be seen as we may look to start medication or have you se someone who can talk to you about the attacks.  I have referred you to a gynecologist. They will be in contact with you. They may schedule a mammogram for you if they see fit.  Please let us know if there is anything else we can do for you.   Panic Attacks Panic attacks are sudden, short-livedsurges of severe anxiety, fear, or discomfort. They may occur for no reason when you are relaxed, when you are anxious, or when you are sleeping. Panic attacks may occur for a number of reasons:   Healthy people occasionally have panic attacks in extreme, life-threatening situations, such as war or natural disasters. Normal anxiety is a protective mechanism of the body that helps [place] react to danger (fight or flight response).  Panic attacks are often seen with anxiety disorders, such as panic disorder, social anxiety disorder, generalized anxiety disorder, and phobias. Anxiety disorders cause excessive or uncontrollable anxiety. They may interfere with your relationships or other life activities.  Panic attacks are sometimes seen with other mental illnesses, such as depression and posttraumatic stress disorder.  Certain medical conditions, prescription medicines, and drugs of abuse can cause panic attacks. SYMPTOMS  Panic attacks start suddenly, peak within 20 minutes, and are accompanied by four or more of the following symptoms:  Pounding heart or fast heart rate (palpitations).  Sweating.  Trembling or shaking.  Shortness of breath or feeling smothered.  Feeling choked.  Chest pain or discomfort.  Nausea or strange feeling in your stomach.  Dizziness, light-headedness,  or feeling like you will faint.  Chills or hot flushes.  Numbness or tingling in your lips or hands and feet.  Feeling that things are not real or feeling that you are not yourself.  Fear of losing control or going crazy.  Fear of dying. Some of these symptoms can mimic serious medical conditions. For example, you may think you are having a heart attack. Although panic attacks can be very scary, they are not life threatening. DIAGNOSIS  Panic attacks are diagnosed through an assessment by your health care provider. Your health care provider will ask questions about your symptoms, such as where and when they occurred. Your health care provider will also ask about your medical history and use of alcohol and drugs, including prescription medicines. Your health care provider may order blood tests or other studies to rule out a serious medical condition. Your health care provider may refer you to a mental health professional for further evaluation. TREATMENT   Most healthy people who have one or two panic attacks in an extreme, life-threatening situation will not require treatment.  The treatment for panic attacks associated with anxiety disorders or other mental illness typically involves counseling with a mental health professional, medicine, or a combination of both. Your health care provider will help determine what treatment is best for you.  Panic attacks due to physical illness usually go away with treatment of the illness. If prescription medicine is causing panic attacks, talk with your health care provider about stopping the medicine, decreasing the dose, or substituting another medicine.  Panic attacks due to alcohol or drug abuse go away  with abstinence. Some adults need professional help in order to stop drinking or using drugs. HOME CARE INSTRUCTIONS   Take all medicines as directed by your health care provider.   Schedule and attend follow-up visits as directed by your health  care provider. It is important to keep all your appointments. SEEK MEDICAL CARE IF:  You are not able to take your medicines as prescribed.  Your symptoms do not improve or get worse. SEEK IMMEDIATE MEDICAL CARE IF:   You experience panic attack symptoms that are different than your usual symptoms.  You have serious thoughts about hurting yourself or others.  You are taking medicine for panic attacks and have a serious side effect. MAKE SURE YOU:  Understand these instructions.  Will watch your condition.  Will get help right away if you are not doing well or get worse. Document Released: 07/29/2005 Document Revised: 08/03/2013 Document Reviewed: 03/12/2013 [facility] Patient Information 2015 [place], [place]. This information is not intended to replace advice given to you by your health care provider. Make sure you discuss any questions you have with your health care provider.

## 2014-10-22 NOTE — Progress Notes (Signed)
Reviewed documentation and EKG and agree w/ assessment and plan. Delman Cheadle, MD MPH

## 2014-11-16 ENCOUNTER — Ambulatory Visit (INDEPENDENT_AMBULATORY_CARE_PROVIDER_SITE_OTHER): Payer: PRIVATE HEALTH INSURANCE | Admitting: Family Medicine

## 2014-11-16 VITALS — BP 118/80 | HR 88 | Temp 97.9°F | Resp 16 | Ht 63.5 in | Wt 176.4 lb

## 2014-11-16 DIAGNOSIS — J302 Other seasonal allergic rhinitis: Secondary | ICD-10-CM

## 2014-11-16 MED ORDER — CETIRIZINE HCL 10 MG PO TABS: 10.0000 mg | ORAL_TABLET | Freq: Every day | ORAL | Status: DC

## 2014-11-16 NOTE — Progress Notes (Signed)
   Subjective:    Patient ID: Katherine Day, female    DOB: 02/10/1976, 39 y.o.   MRN: [id number]  HPI This is a very pleasant 39 yo female who presents today with recurrent allergy symptoms. She has had 1 week of of yellow nasal drainage tinged with blood, nasal congestion and sneezing.. Had sore throat at beginning of illness, none now. Has been taking zyrtec occasionally and took 3 days of an amoxicillin type antibiotic from a friend.   Review of Systems No sore throat, no ear pain,  + congestion, no cough, no SOB, no headache    Objective:   Physical Exam  Constitutional: She is oriented to person, place, and time. She appears well-developed and well-nourished.  HENT:  Head: Normocephalic and atraumatic.  Right Ear: Tympanic membrane, external ear and ear canal normal.  Left Ear: Tympanic membrane, external ear and ear canal normal.  Nose: Mucosal edema and rhinorrhea present. Right sinus exhibits no maxillary sinus tenderness and no frontal sinus tenderness. Left sinus exhibits no maxillary sinus tenderness and no frontal sinus tenderness.  Eyes: Conjunctivae are normal. Pupils are equal, round, and reactive to light.  Neck: Normal range of motion. Neck supple.  Cardiovascular: Normal rate and regular rhythm.   Pulmonary/Chest: Effort normal and breath sounds normal.  Musculoskeletal: Normal range of motion.  Lymphadenopathy:    She has no cervical adenopathy.  Neurological: She is alert and oriented to person, place, and time.  Skin: Skin is warm and dry.  Psychiatric: She has a normal mood and affect. Her behavior is normal. Judgment and thought content normal.  Vitals reviewed. BP 118/80 mmHg  Pulse 88  Temp(Src) 97.9 F (36.6 C) (Oral)  Resp 16  Ht 5' 3.5" (1.613 m)  Wt 176 lb 6.4 oz (80.015 kg)  BMI 30.75 kg/m2  SpO2 97%  LMP 10/28/2014      Assessment & Plan:  1. Other seasonal allergic rhinitis - discussed need for daily zyrtec use, offered patient flonase  but she declined - recommended Afrin BID x 4 days for congestion and sudafed 30 mg po BID - increase fluids - RTC if fever greater than 101, severe headache, SOB   Elby Beck, FNP-BC  [facility]  11/16/2014 9:14 AM

## 2014-11-16 NOTE — Patient Instructions (Signed)
Take zyrtec every day Use afrin (or generic) nasal spray twice a day for up to 4 days- this will help you breathe better Get some sudafed from pharmacy counter (generic fine) and take 1 in the morning and one in the afternoon to help with congestion   Allergic Rhinitis Allergic rhinitis is when the mucous membranes in the nose respond to allergens. Allergens are particles in the air that cause your body to have an allergic reaction. This causes you to release allergic antibodies. Through a chain of events, these eventually cause you to release histamine into the blood stream. Although meant to protect the body, it is this release of histamine that causes your discomfort, such as frequent sneezing, congestion, and an itchy, runny nose.  CAUSES  Seasonal allergic rhinitis (hay fever) is caused by pollen allergens that may come from grasses, trees, and weeds. Year-round allergic rhinitis (perennial allergic rhinitis) is caused by allergens such as house dust mites, pet dander, and mold spores.  SYMPTOMS   Nasal stuffiness (congestion).  Itchy, runny nose with sneezing and tearing of the eyes. DIAGNOSIS  Your health care provider can help you determine the allergen or allergens that trigger your symptoms. If you and your health care provider are unable to determine the allergen, skin or blood testing may be used. TREATMENT  Allergic rhinitis does not have a cure, but it can be controlled by:  Medicines and allergy shots (immunotherapy).  Avoiding the allergen. Hay fever may often be treated with antihistamines in pill or nasal spray forms. Antihistamines block the effects of histamine. There are over-the-counter medicines that may help with nasal congestion and swelling around the eyes. Check with your health care provider before taking or giving this medicine.  If avoiding the allergen or the medicine prescribed do not work, there are many new medicines your health care provider can prescribe.  Stronger medicine may be used if initial measures are ineffective. Desensitizing injections can be used if medicine and avoidance does not work. Desensitization is when a patient is given ongoing shots until the body becomes less sensitive to the allergen. Make sure you follow up with your health care provider if problems continue. HOME CARE INSTRUCTIONS It is not possible to completely avoid allergens, but you can reduce your symptoms by taking steps to limit your exposure to them. It helps to know exactly what you are allergic to so that you can avoid your specific triggers. SEEK MEDICAL CARE IF:   You have a fever.  You develop a cough that does not stop easily (persistent).  You have shortness of breath.  You start wheezing.  Symptoms interfere with normal daily activities. Document Released: 04/23/2001 Document Revised: 08/03/2013 Document Reviewed: 04/05/2013 [facility] Patient Information 2015 [place], [place]. This information is not intended to replace advice given to you by your health care provider. Make sure you discuss any questions you have with your health care provider.

## 2014-11-17 ENCOUNTER — Other Ambulatory Visit (HOSPITAL_COMMUNITY)
Admission: RE | Admit: 2014-11-17 | Discharge: 2014-11-17 | Disposition: A | Discharge status: Home / Self Care | Payer: PRIVATE HEALTH INSURANCE | Source: Ambulatory Visit | Attending: Gynecology | Admitting: Gynecology

## 2014-11-17 ENCOUNTER — Ambulatory Visit (INDEPENDENT_AMBULATORY_CARE_PROVIDER_SITE_OTHER): Payer: PRIVATE HEALTH INSURANCE | Admitting: Gynecology

## 2014-11-17 ENCOUNTER — Telehealth: Payer: Self-pay

## 2014-11-17 ENCOUNTER — Encounter: Payer: Self-pay | Admitting: Gynecology

## 2014-11-17 ENCOUNTER — Telehealth: Payer: Self-pay | Admitting: *Deleted

## 2014-11-17 VITALS — BP 126/80 | Ht 63.5 in | Wt 179.0 lb

## 2014-11-17 DIAGNOSIS — D259 Leiomyoma of uterus, unspecified: Secondary | ICD-10-CM | POA: Diagnosis not present

## 2014-11-17 DIAGNOSIS — Z1151 Encounter for screening for human papillomavirus (HPV): Secondary | ICD-10-CM | POA: Diagnosis present

## 2014-11-17 DIAGNOSIS — N644 Mastodynia: Secondary | ICD-10-CM

## 2014-11-17 DIAGNOSIS — Z01419 Encounter for gynecological examination (general) (routine) without abnormal findings: Secondary | ICD-10-CM

## 2014-11-17 DIAGNOSIS — R140 Abdominal distension (gaseous): Secondary | ICD-10-CM | POA: Diagnosis not present

## 2014-11-17 NOTE — Addendum Note (Signed)
Addended by: Joaquin Music on: 11/17/2014 12:15 PM   Modules accepted: Orders

## 2014-11-17 NOTE — Progress Notes (Signed)
Katherine Day [birth year] [id number]   History:    39 y.o.  for annual gyn exam who is new to the practice. Patient is a gravida 3 para 3 (2 normal spontaneous vaginal deliveries and one cesarean section). Patient having normal menstrual cycles. Patient using condoms for contraception. Patient denies any prior history of any abnormal Pap smear. Patient is from [place]. Patient is a CMA. Patient's been complaining for 2 months of left breast tenderness no palpable masses no nipple discharge. Negative family history of breast cancer. Patient was in for many years ago that she had fibroid uterus. Patient received complaining of low abdominal bloating. She reports no GU or GI complaints. Patient had a negative HIV in 2015.  Past medical history,surgical history, family history and social history were all reviewed and documented in the EPIC chart.  Gynecologic History Patient's last menstrual period was 10/28/2014. Contraception: condoms Last Pap: Several years ago. Results were: normal Last mammogram: No prior study. Results were: No prior study  Obstetric History OB History  Gravida Para Term Preterm AB SAB TAB Ectopic Multiple Living  3 3        3     # Outcome Date GA Lbr Len/2nd Weight Sex Delivery Anes PTL Lv  3 Para           2 Para           1 Para                ROS: A ROS was performed and pertinent positives and negatives are included in the history.  GENERAL: No fevers or chills. HEENT: No change in vision, no earache, sore throat or sinus congestion. NECK: No pain or stiffness. CARDIOVASCULAR: No chest pain or pressure. No palpitations. PULMONARY: No shortness of breath, cough or wheeze. GASTROINTESTINAL: No abdominal pain, nausea, vomiting or diarrhea, melena or bright red blood per rectum. GENITOURINARY: No urinary frequency, urgency, hesitancy or dysuria. MUSCULOSKELETAL: No joint or muscle pain, no back pain, no recent trauma. DERMATOLOGIC: No rash, no itching, no lesions.  ENDOCRINE: No polyuria, polydipsia, no heat or cold intolerance. No recent change in weight. HEMATOLOGICAL: No anemia or easy bruising or bleeding. NEUROLOGIC: No headache, seizures, numbness, tingling or weakness. PSYCHIATRIC: No depression, no loss of interest in normal activity or change in sleep pattern. Left breast tenderness    Exam: chaperone present  BP 126/80 mmHg  Ht 5' 3.5" (1.613 m)  Wt 179 lb (81.194 kg)  BMI 31.21 kg/m2  LMP 10/28/2014  Body mass index is 31.21 kg/(m^2).  General appearance : Well developed well nourished female. No acute distress HEENT: Eyes: no retinal hemorrhage or exudates,  Neck supple, trachea midline, no carotid bruits, no thyroidmegaly Lungs: Clear to auscultation, no rhonchi or wheezes, or rib retractions  Heart: Regular rate and rhythm, no murmurs or gallops Breast:Examined in sitting and supine position were symmetrical in appearance, no palpable masses or tenderness,  no skin retraction, no nipple inversion, no nipple discharge, no skin discoloration, no axillary or supraclavicular lymphadenopathy Abdomen: no palpable masses or tenderness, no rebound or guarding Extremities: no edema or skin discoloration or tenderness  Pelvic:  Bartholin, Urethra, Skene Glands: Within normal limits             Vagina: No gross lesions or discharge  Cervix: No gross lesions or discharge  Uterus  upper limits of normal although limited due to some vaginismus,   Adnexa  Without masses or tenderness, sent as above  Anus and  perineum  normal   Rectovaginal  normal sphincter tone without palpated masses or tenderness             Hemoccult not indicated     Assessment/Plan:  39 y.o. female for annual exam with 2 months history of pulling and painful left breast. She denies any unusual caffeine intake or any family history of breast cancer. She'll be referred to the radiologist for diagnostic mammogram of the left breast. She was encouraged to continue to do her  monthly breast exam. As to her bloating sensation past history of fibroid uterus 1. to obtain an ultrasound here in the office for better assessment infected there was some vaginismus during the pelvic exam. Her Pap smear with HPV screening was done today. The following screening labs were ordered: CBC, comprehensive metabolic panel, cholesterol, TSH, and urinalysis.   Terrance Mass MD, 10:24 AM 11/17/2014

## 2014-11-17 NOTE — Telephone Encounter (Signed)
-----   Message from Terrance Mass, MD sent at 11/17/2014 10:23 AM EDT ----- Anderson [place], please schedule diagnostic mammogram of left breast. Patient with pulling tightening sensations of her left breast. No masses noted. Negative family history of breast cancer

## 2014-11-17 NOTE — Addendum Note (Signed)
Addended by: Thurnell Garbe A on: 11/17/2014 12:13 PM   Modules accepted: Orders

## 2014-11-17 NOTE — Telephone Encounter (Signed)
Orders placed at breast center, they will contact pt to schedule.

## 2014-11-17 NOTE — Telephone Encounter (Signed)
Called pt, advised Afrin and Sudafed from Deborah's notes. Pt understood.

## 2014-11-17 NOTE — Telephone Encounter (Signed)
Pt states she was supposed to be given two medications, and the pharmacy only received one. Please advise

## 2014-11-17 NOTE — Telephone Encounter (Signed)
error 

## 2014-11-18 LAB — CYTOLOGY - PAP

## 2014-11-23 ENCOUNTER — Ambulatory Visit: Payer: PRIVATE HEALTH INSURANCE | Admitting: Gynecology

## 2014-11-23 ENCOUNTER — Other Ambulatory Visit: Payer: PRIVATE HEALTH INSURANCE

## 2014-11-23 NOTE — Telephone Encounter (Signed)
Appointment 12/01/14 @ 3:00pm

## 2014-11-30 ENCOUNTER — Other Ambulatory Visit: Payer: Self-pay | Admitting: Gynecology

## 2014-11-30 ENCOUNTER — Ambulatory Visit (INDEPENDENT_AMBULATORY_CARE_PROVIDER_SITE_OTHER): Payer: PRIVATE HEALTH INSURANCE | Admitting: Gynecology

## 2014-11-30 ENCOUNTER — Ambulatory Visit (INDEPENDENT_AMBULATORY_CARE_PROVIDER_SITE_OTHER): Payer: PRIVATE HEALTH INSURANCE

## 2014-11-30 ENCOUNTER — Encounter: Payer: Self-pay | Admitting: Gynecology

## 2014-11-30 DIAGNOSIS — N852 Hypertrophy of uterus: Secondary | ICD-10-CM

## 2014-11-30 DIAGNOSIS — R140 Abdominal distension (gaseous): Secondary | ICD-10-CM | POA: Diagnosis not present

## 2014-11-30 DIAGNOSIS — D259 Leiomyoma of uterus, unspecified: Secondary | ICD-10-CM

## 2014-11-30 DIAGNOSIS — N644 Mastodynia: Secondary | ICD-10-CM

## 2014-11-30 LAB — COMPREHENSIVE METABOLIC PANEL
ALT: 25 U/L (ref 0–35)
AST: 19 U/L (ref 0–37)
Albumin: 3.9 g/dL (ref 3.5–5.2)
Alkaline Phosphatase: 82 U/L (ref 39–117)
BILIRUBIN TOTAL: 0.7 mg/dL (ref 0.2–1.2)
BUN: 14 mg/dL (ref 6–23)
CO2: 27 meq/L (ref 19–32)
Calcium: 9.2 mg/dL (ref 8.4–10.5)
Chloride: 102 mEq/L (ref 96–112)
Creat: 0.75 mg/dL (ref 0.50–1.10)
GLUCOSE: 91 mg/dL (ref 70–99)
Potassium: 4 mEq/L (ref 3.5–5.3)
Sodium: 139 mEq/L (ref 135–145)
Total Protein: 6.9 g/dL (ref 6.0–8.3)

## 2014-11-30 LAB — CBC WITH DIFFERENTIAL/PLATELET
BASOS ABS: 0 10*3/uL (ref 0.0–0.1)
BASOS PCT: 1 % (ref 0–1)
EOS ABS: 0.2 10*3/uL (ref 0.0–0.7)
EOS PCT: 5 % (ref 0–5)
HCT: 39.5 % (ref 36.0–46.0)
Hemoglobin: 13 g/dL (ref 12.0–15.0)
LYMPHS PCT: 43 % (ref 12–46)
Lymphs Abs: 2.1 10*3/uL (ref 0.7–4.0)
MCH: 30.6 pg (ref 26.0–34.0)
MCHC: 32.9 g/dL (ref 30.0–36.0)
MCV: 92.9 fL (ref 78.0–100.0)
MONO ABS: 0.5 10*3/uL (ref 0.1–1.0)
MPV: 9.4 fL (ref 8.6–12.4)
Monocytes Relative: 10 % (ref 3–12)
NEUTROS PCT: 41 % — AB (ref 43–77)
Neutro Abs: 2 10*3/uL (ref 1.7–7.7)
PLATELETS: 307 10*3/uL (ref 150–400)
RBC: 4.25 MIL/uL (ref 3.87–5.11)
RDW: 13.1 % (ref 11.5–15.5)
WBC: 4.9 10*3/uL (ref 4.0–10.5)

## 2014-11-30 LAB — TSH: TSH: 0.835 u[IU]/mL (ref 0.350–4.500)

## 2014-11-30 LAB — CHOLESTEROL, TOTAL: Cholesterol: 144 mg/dL (ref 0–200)

## 2014-11-30 NOTE — Progress Notes (Signed)
   Patient presented to the office today to discuss her ultrasound. She was seen for her annual gynecological examination on April 7 see detailed note from that visit. Patient is from [place] and was informed many years ago she had a fibroid uterus. She reports normal menstrual cycles past history of normal Pap smears and is using condoms for contraception. She been experiencing left breast tenderness at time of the last visit and is scheduled for diagnostic mammogram tomorrow. At time of her pelvic examination due to vaginismus and adequate assessment of the uterus was not possible and with her past history that she had been informed of fibroid uterus had recommended an ultrasound and is what she is here for today.  Ultrasound report: Uterus measures 6.3 x 5.2 x 3.9 cm with endometrial stripe 6 mm. Both right and left ovary were normal. No fluid in the cul-de-sac. No masses in the adnexa.  Patient was reassured of the findings of the ultrasound. We'll wait for the results of the mammogram. Her recent Pap smear was normal. She's otherwise scheduled to return back next year for her annual exam or when necessary. Her screening lab work was drawn today to include CBC, comprehensive metabolic panel, cholesterol, TSH and urinalysis.

## 2014-12-01 ENCOUNTER — Ambulatory Visit
Admission: RE | Admit: 2014-12-01 | Discharge: 2014-12-01 | Disposition: A | Discharge status: Home / Self Care | Payer: PRIVATE HEALTH INSURANCE | Source: Ambulatory Visit | Attending: Gynecology | Admitting: Gynecology

## 2014-12-01 DIAGNOSIS — N644 Mastodynia: Secondary | ICD-10-CM

## 2015-01-13 ENCOUNTER — Ambulatory Visit (INDEPENDENT_AMBULATORY_CARE_PROVIDER_SITE_OTHER): Payer: PRIVATE HEALTH INSURANCE | Admitting: Physician Assistant

## 2015-01-13 VITALS — BP 116/68 | HR 84 | Temp 98.5°F | Resp 18 | Ht 62.5 in | Wt 182.0 lb

## 2015-01-13 DIAGNOSIS — N912 Amenorrhea, unspecified: Secondary | ICD-10-CM | POA: Diagnosis not present

## 2015-01-13 DIAGNOSIS — Z3201 Encounter for pregnancy test, result positive: Secondary | ICD-10-CM | POA: Diagnosis not present

## 2015-01-13 DIAGNOSIS — Z3490 Encounter for supervision of normal pregnancy, unspecified, unspecified trimester: Secondary | ICD-10-CM

## 2015-01-13 LAB — POCT URINE PREGNANCY: PREG TEST UR: POSITIVE

## 2015-01-13 NOTE — Patient Instructions (Signed)
Estimated gestation 7 weeks 4 days. Estimated delivery date 08/27/2015  Follow up with your OB/GYN. Avoid soft cheese, deli meat and cook all meat well-done.  First Trimester of Pregnancy The first trimester of pregnancy is from week 1 until the end of week 12 (months 1 through 3). A week after a sperm fertilizes an egg, the egg will implant on the wall of the uterus. This embryo will begin to develop into a baby. Genes from you and your partner are forming the baby. The female genes determine whether the baby is a boy or a girl. At 6-8 weeks, the eyes and face are formed, and the heartbeat can be seen on ultrasound. At the end of 12 weeks, all the baby's organs are formed.  Now that you are pregnant, you will want to do everything you can to have a healthy baby. Two of the most important things are to get good prenatal care and to follow your health care provider's instructions. Prenatal care is all the medical care you receive before the baby's birth. This care will help prevent, find, and treat any problems during the pregnancy and childbirth. BODY CHANGES Your body goes through many changes during pregnancy. The changes vary from woman to woman.   You may gain or lose a couple of pounds at first.  You may feel sick to your stomach (nauseous) and throw up (vomit). If the vomiting is uncontrollable, call your health care provider.  You may tire easily.  You may develop headaches that can be relieved by medicines approved by your health care provider.  You may urinate more often. Painful urination may mean you have a bladder infection.  You may develop heartburn as a result of your pregnancy.  You may develop constipation because certain hormones are causing the muscles that push waste through your intestines to slow down.  You may develop hemorrhoids or swollen, bulging veins (varicose veins).  Your breasts may begin to grow larger and become tender. Your nipples may stick out more, and  the tissue that surrounds them (areola) may become darker.  Your gums may bleed and may be sensitive to brushing and flossing.  Dark spots or blotches (chloasma, mask of pregnancy) may develop on your face. This will likely fade after the baby is born.  Your menstrual periods will stop.  You may have a loss of appetite.  You may develop cravings for certain kinds of food.  You may have changes in your emotions from day to day, such as being excited to be pregnant or being concerned that something may go wrong with the pregnancy and baby.  You may have more vivid and strange dreams.  You may have changes in your hair. These can include thickening of your hair, rapid growth, and changes in texture. Some women also have hair loss during or after pregnancy, or hair that feels dry or thin. Your hair will most likely return to normal after your baby is born. WHAT TO EXPECT AT YOUR PRENATAL VISITS During a routine prenatal visit:  You will be weighed to make sure you and the baby are growing normally.  Your blood pressure will be taken.  Your abdomen will be measured to track your baby's growth.  The fetal heartbeat will be listened to starting around week 10 or 12 of your pregnancy.  Test results from any previous visits will be discussed. Your health care provider may ask you:  How you are feeling.  If you are feeling the baby  move.  If you have had any abnormal symptoms, such as leaking fluid, bleeding, severe headaches, or abdominal cramping.  If you have any questions. Other tests that may be performed during your first trimester include:  Blood tests to find your blood type and to check for the presence of any previous infections. They will also be used to check for low iron levels (anemia) and Rh antibodies. Later in the pregnancy, blood tests for diabetes will be done along with other tests if problems develop.  Urine tests to check for infections, diabetes, or protein in  the urine.  An ultrasound to confirm the proper growth and development of the baby.  An amniocentesis to check for possible genetic problems.  Fetal screens for spina bifida and Down syndrome.  You may need other tests to make sure you and the baby are doing well. HOME CARE INSTRUCTIONS  Medicines  Follow your health care provider's instructions regarding medicine use. Specific medicines may be either safe or unsafe to take during pregnancy.  Take your prenatal vitamins as directed.  If you develop constipation, try taking a stool softener if your health care provider approves. Diet  Eat regular, well-balanced meals. Choose a variety of foods, such as meat or vegetable-based protein, fish, milk and low-fat dairy products, vegetables, fruits, and whole grain breads and cereals. Your health care provider will help you determine the amount of weight gain that is right for you.  Avoid raw meat and uncooked cheese. These carry germs that can cause birth defects in the baby.  Eating four or five small meals rather than three large meals a day may help relieve nausea and vomiting. If you start to feel nauseous, eating a few soda crackers can be helpful. Drinking liquids between meals instead of during meals also seems to help nausea and vomiting.  If you develop constipation, eat more high-fiber foods, such as fresh vegetables or fruit and whole grains. Drink enough fluids to keep your urine clear or pale yellow. Activity and Exercise  Exercise only as directed by your health care provider. Exercising will help you:  Control your weight.  Stay in shape.  Be prepared for labor and delivery.  Experiencing pain or cramping in the lower abdomen or low back is a good sign that you should stop exercising. Check with your health care provider before continuing normal exercises.  Try to avoid standing for long periods of time. Move your legs often if you must stand in one place for a long  time.  Avoid heavy lifting.  Wear low-heeled shoes, and practice good posture.  You may continue to have sex unless your health care provider directs you otherwise. Relief of Pain or Discomfort  Wear a good support bra for breast tenderness.   Take warm sitz baths to soothe any pain or discomfort caused by hemorrhoids. Use hemorrhoid cream if your health care provider approves.   Rest with your legs elevated if you have leg cramps or low back pain.  If you develop varicose veins in your legs, wear support hose. Elevate your feet for 15 minutes, 3-4 times a day. Limit salt in your diet. Prenatal Care  Schedule your prenatal visits by the twelfth week of pregnancy. They are usually scheduled monthly at first, then more often in the last 2 months before delivery.  Write down your questions. Take them to your prenatal visits.  Keep all your prenatal visits as directed by your health care provider. Safety  Wear your seat belt  at all times when driving.  Make a list of emergency phone numbers, including numbers for family, friends, the hospital, and police and fire departments. General Tips  Ask your health care provider for a referral to a local prenatal education class. Begin classes no later than at the beginning of month 6 of your pregnancy.  Ask for help if you have counseling or nutritional needs during pregnancy. Your health care provider can offer advice or refer you to specialists for help with various needs.  Do not use hot tubs, steam rooms, or saunas.  Do not douche or use tampons or scented sanitary pads.  Do not cross your legs for long periods of time.  Avoid cat litter boxes and soil used by cats. These carry germs that can cause birth defects in the baby and possibly loss of the fetus by miscarriage or stillbirth.  Avoid all smoking, herbs, alcohol, and medicines not prescribed by your health care provider. Chemicals in these affect the formation and growth of  the baby.  Schedule a dentist appointment. At home, brush your teeth with a soft toothbrush and be gentle when you floss. SEEK MEDICAL CARE IF:   You have dizziness.  You have mild pelvic cramps, pelvic pressure, or nagging pain in the abdominal area.  You have persistent nausea, vomiting, or diarrhea.  You have a bad smelling vaginal discharge.  You have pain with urination.  You notice increased swelling in your face, hands, legs, or ankles. SEEK IMMEDIATE MEDICAL CARE IF:   You have a fever.  You are leaking fluid from your vagina.  You have spotting or bleeding from your vagina.  You have severe abdominal cramping or pain.  You have rapid weight gain or loss.  You vomit blood or material that looks like coffee grounds.  You are exposed to [place] measles and have never had them.  You are exposed to fifth disease or chickenpox.  You develop a severe headache.  You have shortness of breath.  You have any kind of trauma, such as from a fall or a car accident. Document Released: 07/23/2001 Document Revised: 12/13/2013 Document Reviewed: 06/08/2013 [facility] Patient Information 2015 [place], [place]. This information is not intended to replace advice given to you by your health care provider. Make sure you discuss any questions you have with your health care provider.

## 2015-01-13 NOTE — Progress Notes (Signed)
[facility] [address] [place] [address] [phone]  Date:  01/13/2015   Name:  Katherine Day   DOB:  1976-02-08   MRN:  [id number]  PCP:  No PCP Per Patient    Chief Complaint: Possible Pregnancy   History of Present Illness:  This is a 39 y.o. female with PMH allergic rhinitis who is presenting for pregnancy test and wanting to know her due date. Her LMP was 11/21/14. She already has an appt schedule with OB on 6/23 but needs proof of pregnancy and estimated delivery date to apply for medicaid. This is her fourth pregnancy. She has had 3 live births - 1 by vaginal delivery and 2 by cesarean section. Last pregnancy she had placenta previa. Last pregnancy 5 years ago. She is healthy. She does not take medications on a daily basis. No history of HTN or DM. Denies N/V, abdominal pain, vaginal bleeding, vaginal discharge, fatigue.  Review of Systems:  Review of Systems  Constitutional: Negative for fever, chills and fatigue.  Gastrointestinal: Negative for nausea, vomiting and abdominal pain.  Genitourinary: Negative for vaginal bleeding and vaginal discharge.  Musculoskeletal: Negative for back pain.  Hematological: Negative for adenopathy.    Patient Active Problem List   Diagnosis Date Noted  . Seasonal allergies 11/23/2012    Prior to Admission medications   Medication Sig Start Date End Date Taking? Authorizing Provider         cetirizine (ZYRTEC) 10 MG tablet Take 1 tablet (10 mg total) by mouth daily. Patient not taking: Reported on 01/13/2015 11/16/14   Elby Beck, FNP    No Known Allergies  Past Surgical History  Procedure Laterality Date  . Cesarean section  2009, 2011    x2    History  Substance Use Topics  . Smoking status: Never Smoker   . Smokeless tobacco: Never Used  . Alcohol Use: No    History reviewed. No pertinent family history.  Medication list has been reviewed and updated.  Physical  Examination:  Physical Exam  Constitutional: She is oriented to person, place, and time. She appears well-developed and well-nourished. No distress.  HENT:  Head: Normocephalic and atraumatic.  Right Ear: Hearing normal.  Left Ear: Hearing normal.  Nose: Nose normal.  Eyes: Conjunctivae and lids are normal. Right eye exhibits no discharge. Left eye exhibits no discharge. No scleral icterus.  Pulmonary/Chest: Effort normal. No respiratory distress.  Musculoskeletal: Normal range of motion.  Neurological: She is alert and oriented to person, place, and time.  Skin: Skin is warm, dry and intact. No lesion and no rash noted.  Psychiatric: She has a normal mood and affect. Her speech is normal and behavior is normal. Thought content normal.   BP 116/68 mmHg  Pulse 84  Temp(Src) 98.5 F (36.9 C) (Oral)  Resp 18  Ht 5' 2.5" (1.588 m)  Wt 182 lb (82.555 kg)  BMI 32.74 kg/m2  SpO2 98%  LMP 11/21/2014  Results for orders placed or performed in visit on 01/13/15  POCT urine pregnancy  Result Value Ref Range   Preg Test, Ur Positive     Assessment and Plan:  1. Absent menses 2. Pregnancy   Estimated gestation 7 weeks 4 days and estimated delivery 08/27/2015. We discussed pregnancy precautions. She has her first prenatal visit 02/02/15. - POCT urine pregnancy   Benjaman Pott. Drenda Freeze, M[facility]  01/13/2015

## 2015-01-15 ENCOUNTER — Telehealth: Payer: Self-pay

## 2015-01-15 DIAGNOSIS — Z3490 Encounter for supervision of normal pregnancy, unspecified, unspecified trimester: Secondary | ICD-10-CM

## 2015-01-15 NOTE — Telephone Encounter (Signed)
Pt is requesting prenatal vitamins sent to her pharmacy. She would also like for these vitamins to be "smaller than the last ones" we gave her.

## 2015-01-17 MED ORDER — PRENATAL 19 29-1 MG PO TABS: 1.0000 | ORAL_TABLET | Freq: Every day | ORAL | Status: DC

## 2015-01-17 NOTE — Telephone Encounter (Signed)
Prenatal vitamins sent to pharmacy

## 2015-01-18 NOTE — Telephone Encounter (Signed)
Left message vitamins sent in.

## 2015-01-19 ENCOUNTER — Inpatient Hospital Stay (HOSPITAL_COMMUNITY): Payer: Medicaid Other

## 2015-01-19 ENCOUNTER — Encounter (HOSPITAL_COMMUNITY): Payer: Self-pay | Admitting: *Deleted

## 2015-01-19 ENCOUNTER — Inpatient Hospital Stay (HOSPITAL_COMMUNITY)
Admission: AD | Admit: 2015-01-19 | Discharge: 2015-01-19 | Disposition: A | Discharge status: Home / Self Care | Payer: Medicaid Other | Source: Ambulatory Visit | Attending: Obstetrics & Gynecology | Admitting: Obstetrics & Gynecology

## 2015-01-19 DIAGNOSIS — O209 Hemorrhage in early pregnancy, unspecified: Secondary | ICD-10-CM | POA: Diagnosis present

## 2015-01-19 DIAGNOSIS — Z3A01 Less than 8 weeks gestation of pregnancy: Secondary | ICD-10-CM | POA: Insufficient documentation

## 2015-01-19 HISTORY — DX: Other specified health status: Z78.9

## 2015-01-19 LAB — WET PREP, GENITAL
Clue Cells Wet Prep HPF POC: NONE SEEN
TRICH WET PREP: NONE SEEN
Yeast Wet Prep HPF POC: NONE SEEN

## 2015-01-19 LAB — HCG, QUANTITATIVE, PREGNANCY: hCG, Beta Chain, Quant, S: 4007 m[IU]/mL — ABNORMAL HIGH (ref <5)

## 2015-01-19 LAB — CBC WITH DIFFERENTIAL/PLATELET
Basophils Absolute: 0 10*3/uL (ref 0.0–0.1)
Basophils Relative: 0 % (ref 0–1)
EOS ABS: 0.1 10*3/uL (ref 0.0–0.7)
Eosinophils Relative: 1 % (ref 0–5)
HEMATOCRIT: 36.4 % (ref 36.0–46.0)
HEMOGLOBIN: 12.5 g/dL (ref 12.0–15.0)
Lymphocytes Relative: 34 % (ref 12–46)
Lymphs Abs: 2.1 10*3/uL (ref 0.7–4.0)
MCH: 31.6 pg (ref 26.0–34.0)
MCHC: 34.3 g/dL (ref 30.0–36.0)
MCV: 91.9 fL (ref 78.0–100.0)
Monocytes Absolute: 0.5 10*3/uL (ref 0.1–1.0)
Monocytes Relative: 9 % (ref 3–12)
NEUTROS ABS: 3.5 10*3/uL (ref 1.7–7.7)
Neutrophils Relative %: 56 % (ref 43–77)
PLATELETS: 249 10*3/uL (ref 150–400)
RBC: 3.96 MIL/uL (ref 3.87–5.11)
RDW: 12.5 % (ref 11.5–15.5)
WBC: 6.2 10*3/uL (ref 4.0–10.5)

## 2015-01-19 NOTE — MAU Note (Signed)
Pt reports she has had moderate vag bleeding since yesterday.

## 2015-01-19 NOTE — Discharge Instructions (Signed)
Pelvic Rest Pelvic rest is sometimes recommended for women when:   The placenta is partially or completely covering the opening of the cervix (placenta previa).  There is bleeding between the uterine wall and the amniotic sac in the first trimester (subchorionic hemorrhage).  The cervix begins to open without labor starting (incompetent cervix, cervical insufficiency).  The labor is too early (preterm labor). HOME CARE INSTRUCTIONS  Do not have sexual intercourse, stimulation, or an orgasm.  Do not use tampons, douche, or put anything in the vagina.  Do not lift anything over 10 pounds (4.5 kg).  Avoid strenuous activity or straining your pelvic muscles. SEEK MEDICAL CARE IF:  You have any vaginal bleeding during pregnancy. Treat this as a potential emergency.  You have cramping pain felt low in the stomach (stronger than menstrual cramps).  You notice vaginal discharge (watery, mucus, or bloody).  You have a low, dull backache.  There are regular contractions or uterine tightening. SEEK IMMEDIATE MEDICAL CARE IF: You have vaginal bleeding and have placenta previa.  Document Released: 11/23/2010 Document Revised: 10/21/2011 Document Reviewed: 11/23/2010 [facility] Patient Information 2015 [place], [place]. This information is not intended to replace advice given to you by your health care provider. Make sure you discuss any questions you have with your health care provider.  Threatened Miscarriage A threatened miscarriage is when you have vaginal bleeding during your first 20 weeks of pregnancy but the pregnancy has not ended. Your doctor will do tests to make sure you are still pregnant. The cause of the bleeding may not be known. This condition does not mean your pregnancy will end. It does increase the risk of it ending (complete miscarriage). HOME CARE   Make sure you keep all your doctor visits for prenatal care.  Get plenty of rest.  Do not have sex or use tampons if  you have vaginal bleeding.  Do not douche.  Do not smoke or use drugs.  Do not drink alcohol.  Avoid caffeine. GET HELP IF:  You have light bleeding from your vagina.  You have belly pain or cramping.  You have a fever. GET HELP RIGHT AWAY IF:   You have heavy bleeding from your vagina.  You have clots of blood coming from your vagina.  You have bad pain or cramps in your low back or belly.  You have fever, chills, and bad belly pain. MAKE SURE YOU:   Understand these instructions.  Will watch your condition.  Will get help right away if you are not doing well or get worse. Document Released: 07/11/2008 Document Revised: 08/03/2013 Document Reviewed: 05/25/2013 [facility] Patient Information 2015 [place], [place]. This information is not intended to replace advice given to you by your health care provider. Make sure you discuss any questions you have with your health care provider.

## 2015-01-19 NOTE — Progress Notes (Signed)
  Medical screening examination/treatment/procedure(s) were performed by non-physician practitioner and as supervising physician I was immediately available for consultation/collaboration.     

## 2015-01-19 NOTE — MAU Provider Note (Signed)
History     CSN: [id number]  Arrival date and time: 01/19/15 1634   First Provider Initiated Contact with Patient 01/19/15 1711      Chief Complaint  Patient presents with  . Vaginal Bleeding   HPI  Ms. Katherine Day is a 39 y.o. G4P3 at [redacted]w[redacted]d by LMP who presents to MAU today with complaint of vaginal bleeding and abdominal pain. The patient states pain started last night and is moderate at times, but intermittent. She states pain is in the lower abdomen bilaterally. She has not taken anything for pain. She also states vaginal bleeding started today. She states it is heavy with large clots. She denies fever or N/V/D.   OB History    Gravida Para Term Preterm AB TAB SAB Ectopic Multiple Living   4 3        3       Past Medical History  Diagnosis Date  . Medical history non-contributory     Past Surgical History  Procedure Laterality Date  . Cesarean section  2009, 2011    x2    History reviewed. No pertinent family history.  History  Substance Use Topics  . Smoking status: Never Smoker   . Smokeless tobacco: Never Used  . Alcohol Use: No    Allergies: No Known Allergies  Prescriptions prior to admission  Medication Sig Dispense Refill Last Dose  . Naproxen Sodium (ALEVE PO) Take 2 tablets by mouth every 6 (six) hours as needed (for pain).   01/19/2015 at Unknown time  . Prenatal Vit-DSS-Fe Fum-FA (PRENATAL 19) 29-1 MG TABS Take 1 tablet by mouth daily. 30 tablet 6 01/19/2015 at Unknown time  . cetirizine (ZYRTEC) 10 MG tablet Take 1 tablet (10 mg total) by mouth daily. (Patient not taking: Reported on 01/13/2015) 90 tablet 3 Not Taking at Unknown time    Review of Systems  Constitutional: Negative for fever and malaise/fatigue.  Gastrointestinal: Positive for abdominal pain. Negative for nausea, vomiting, diarrhea and constipation.  Genitourinary: Negative for dysuria, urgency and frequency.       + vaginal bleeding   Physical Exam   Blood pressure 134/78, pulse  84, temperature 98 F (36.7 C), temperature source Oral, resp. rate 18, height 5\' 2"  (1.575 m), weight 182 lb 4 oz (82.668 kg), last menstrual period 11/21/2014, SpO2 98 %.  Physical Exam  Nursing note and vitals reviewed. Constitutional: She is oriented to person, place, and time. She appears well-developed and well-nourished. No distress.  HENT:  Head: Normocephalic and atraumatic.  Cardiovascular: Normal rate.   Respiratory: Effort normal.  GI: Soft. She exhibits no distension and no mass. There is no tenderness. There is no rebound and no guarding.  Neurological: She is alert and oriented to person, place, and time.  Skin: Skin is warm and dry. No erythema.  Psychiatric: She has a normal mood and affect.   Results for orders placed or performed during the hospital encounter of 01/19/15 (from the past 24 hour(s))  CBC with Differential/Platelet     Status: None   Collection Time: 01/19/15  5:24 PM  Result Value Ref Range   WBC 6.2 4.0 - 10.5 K/uL   RBC 3.96 3.87 - 5.11 MIL/uL   Hemoglobin 12.5 12.0 - 15.0 g/dL   HCT 36.4 36.0 - 46.0 %   MCV 91.9 78.0 - 100.0 fL   MCH 31.6 26.0 - 34.0 pg   MCHC 34.3 30.0 - 36.0 g/dL   RDW 12.5 11.5 - 15.5 %  Platelets 249 150 - 400 K/uL   Neutrophils Relative % 56 43 - 77 %   Neutro Abs 3.5 1.7 - 7.7 K/uL   Lymphocytes Relative 34 12 - 46 %   Lymphs Abs 2.1 0.7 - 4.0 K/uL   Monocytes Relative 9 3 - 12 %   Monocytes Absolute 0.5 0.1 - 1.0 K/uL   Eosinophils Relative 1 0 - 5 %   Eosinophils Absolute 0.1 0.0 - 0.7 K/uL   Basophils Relative 0 0 - 1 %   Basophils Absolute 0.0 0.0 - 0.1 K/uL  hCG, quantitative, pregnancy     Status: Abnormal   Collection Time: 01/19/15  5:24 PM  Result Value Ref Range   hCG, Beta Chain, Quant, S 4007 (H) <5 mIU/mL   US Ob Comp Less 14 Wks  01/19/2015   CLINICAL DATA:  Heavy vaginal bleeding. Gestational age by LMP of 8 weeks 3 days.  EXAM: OBSTETRIC <14 WK [place] AND TRANSVAGINAL OB US  TECHNIQUE: Both  transabdominal and transvaginal ultrasound examinations were performed for complete evaluation of the gestation as well as the maternal uterus, adnexal regions, and pelvic cul-de-sac. Transvaginal technique was performed to assess early pregnancy.  COMPARISON:  None.  FINDINGS: Intrauterine gestational sac: Single, with irregular shape in lower uterine segment  Yolk sac:  Visualized  Embryo:  Not visualized  MSD: 11  mm   5 w   6  d  Maternal uterus/adnexae: Neither ovary well visualized on this exam. A 3.0 cm simple appearing cyst is seen in the left adnexa, with differential diagnosis including corpus luteum cyst or benign paraovarian cyst. No suspicious adnexal masses or free fluid identified.  IMPRESSION: Single irregular gestational sac in lower uterine segment. Findings are suspicious but not yet definitive for failed pregnancy. Recommend follow-up US in 10-14 days for definitive diagnosis. This recommendation follows SRU consensus guidelines: Diagnostic Criteria for Nonviable Pregnancy Early in the First Trimester. [place] Med 2013; 751:0258-52.   Electronically Signed   By: Earle Gell M.D.   On: 01/19/2015 20:24   US Ob Transvaginal  01/19/2015   CLINICAL DATA:  Heavy vaginal bleeding. Gestational age by LMP of 8 weeks 3 days.  EXAM: OBSTETRIC <14 WK [place] AND TRANSVAGINAL OB US  TECHNIQUE: Both transabdominal and transvaginal ultrasound examinations were performed for complete evaluation of the gestation as well as the maternal uterus, adnexal regions, and pelvic cul-de-sac. Transvaginal technique was performed to assess early pregnancy.  COMPARISON:  None.  FINDINGS: Intrauterine gestational sac: Single, with irregular shape in lower uterine segment  Yolk sac:  Visualized  Embryo:  Not visualized  MSD: 11  mm   5 w   6  d  Maternal uterus/adnexae: Neither ovary well visualized on this exam. A 3.0 cm simple appearing cyst is seen in the left adnexa, with differential diagnosis including corpus luteum cyst  or benign paraovarian cyst. No suspicious adnexal masses or free fluid identified.  IMPRESSION: Single irregular gestational sac in lower uterine segment. Findings are suspicious but not yet definitive for failed pregnancy. Recommend follow-up US in 10-14 days for definitive diagnosis. This recommendation follows SRU consensus guidelines: Diagnostic Criteria for Nonviable Pregnancy Early in the First Trimester. [employer] Med 2013; 778:2423-53.   Electronically Signed   By: Earle Gell M.D.   On: 01/19/2015 20:24    MAU Course  Procedures None  MDM +UPT UA, wet prep, GC/chlamydia, CBC, quant hCG, HIV, RPR and [place] today to rule out ectopic pregnancy  O+ blood type from previous visit  Assessment and Plan  A: IUGS and YS at [redacted]w[redacted]d Vaginal bleeding in pregnancy prior to [redacted] weeks gestation  P: Discharge home Bleeding precautions discussed Repeat US ordered as outpatient in 1 week to confirm viability vs SAB Patient will return to MAU after [place] for results or sooner if her condition were to change or worsen  Luvenia Redden, PA-C  01/19/2015, 8:30 PM

## 2015-01-20 LAB — GC/CHLAMYDIA PROBE AMP (~~LOC~~) NOT AT ARMC
Chlamydia: NEGATIVE
NEISSERIA GONORRHEA: NEGATIVE

## 2015-01-20 LAB — RPR: RPR Ser Ql: NONREACTIVE

## 2015-01-20 LAB — HIV ANTIBODY (ROUTINE TESTING W REFLEX): HIV Screen 4th Generation wRfx: NONREACTIVE

## 2015-01-26 ENCOUNTER — Ambulatory Visit (HOSPITAL_COMMUNITY)
Admission: RE | Admit: 2015-01-26 | Discharge: 2015-01-26 | Disposition: A | Discharge status: Home / Self Care | Payer: PRIVATE HEALTH INSURANCE | Source: Ambulatory Visit | Attending: Medical | Admitting: Medical

## 2015-01-26 ENCOUNTER — Inpatient Hospital Stay (HOSPITAL_COMMUNITY)
Admission: AD | Admit: 2015-01-26 | Discharge: 2015-01-26 | Disposition: A | Discharge status: Home / Self Care | Payer: Medicaid Other | Source: Ambulatory Visit | Attending: Obstetrics & Gynecology | Admitting: Obstetrics & Gynecology

## 2015-01-26 DIAGNOSIS — O209 Hemorrhage in early pregnancy, unspecified: Secondary | ICD-10-CM | POA: Diagnosis present

## 2015-01-26 DIAGNOSIS — O039 Complete or unspecified spontaneous abortion without complication: Secondary | ICD-10-CM | POA: Diagnosis not present

## 2015-01-26 DIAGNOSIS — O021 Missed abortion: Secondary | ICD-10-CM | POA: Insufficient documentation

## 2015-01-26 LAB — CBC WITH DIFFERENTIAL/PLATELET
Basophils Absolute: 0 10*3/uL (ref 0.0–0.1)
Basophils Relative: 0 % (ref 0–1)
Eosinophils Absolute: 0 10*3/uL (ref 0.0–0.7)
Eosinophils Relative: 1 % (ref 0–5)
HCT: 34.7 % — ABNORMAL LOW (ref 36.0–46.0)
Hemoglobin: 11.8 g/dL — ABNORMAL LOW (ref 12.0–15.0)
LYMPHS PCT: 36 % (ref 12–46)
Lymphs Abs: 2 10*3/uL (ref 0.7–4.0)
MCH: 31.6 pg (ref 26.0–34.0)
MCHC: 34 g/dL (ref 30.0–36.0)
MCV: 92.8 fL (ref 78.0–100.0)
Monocytes Absolute: 0.4 10*3/uL (ref 0.1–1.0)
Monocytes Relative: 8 % (ref 3–12)
Neutro Abs: 3 10*3/uL (ref 1.7–7.7)
Neutrophils Relative %: 55 % (ref 43–77)
Platelets: 247 10*3/uL (ref 150–400)
RBC: 3.74 MIL/uL — ABNORMAL LOW (ref 3.87–5.11)
RDW: 12.6 % (ref 11.5–15.5)
WBC: 5.4 10*3/uL (ref 4.0–10.5)

## 2015-01-26 LAB — HCG, QUANTITATIVE, PREGNANCY: hCG, Beta Chain, Quant, S: 83 m[IU]/mL — ABNORMAL HIGH (ref <5)

## 2015-01-26 NOTE — MAU Provider Note (Signed)
Ms. Satara Virella is a 39 y.o. G4P3 at [redacted]w[redacted]d who presents to MAU today for follow-up US results. She denies abdominal pain or fever today. She states continued light vaginal bleeding.    LMP 11/21/2014  GENERAL: Well-developed, well-nourished female in no acute distress.  HEENT: Normocephalic, atraumatic.   LUNGS: Effort normal HEART: Regular rate  SKIN: Warm, dry and without erythema PSYCH: Normal mood and affect  US Ob Transvaginal  01/26/2015   CLINICAL DATA:  Pregnant, vaginal bleeding/spotting  EXAM: TRANSVAGINAL OB ULTRASOUND  TECHNIQUE: Transvaginal ultrasound was performed for complete evaluation of the gestation as well as the maternal uterus, adnexal regions, and pelvic cul-de-sac.  COMPARISON:  01/19/2015  FINDINGS: Intrauterine gestational sac: No IUP is visualized.  Maternal uterus/adnexae: Endometrial complex is mildly heterogeneous, measuring 10 mm, without focal color Doppler flow. This appearance is indeterminate but favored to reflect blood products/debris.  Bilateral ovaries are within normal limits.  No free fluid.  IMPRESSION: No IUP is visualized, compatible with missed abortion.  Endometrial complex is mildly heterogeneous and measures 10 mm, indeterminate but favored to reflect blood products/debris. No color Doppler flow to suggest vascularized retained products of conception. Correlate with beta HCG.   Electronically Signed   By: Julian Hy M.D.   On: 01/26/2015 10:00    A: Complete AB  P: Discharge home Bleeding precautions discussed Patient referred to [facility] for follow-up in 2 weeks Patient may return to MAU as needed or if her condition were to change or worsen   Luvenia Redden, PA-C  01/26/2015 11:37 AM

## 2015-01-26 NOTE — Discharge Instructions (Signed)
Miscarriage A miscarriage is the loss of an unborn baby (fetus) before the 20th week of pregnancy. The cause is often unknown.  HOME CARE  You may need to stay in bed (bed rest), or you may be able to do light activity. Go about activity as told by your doctor.  Have help at home.  Write down how many pads you use each day. Write down how soaked they are.  Do not use tampons. Do not wash out your vagina (douche) or have sex (intercourse) until your doctor approves.  Only take medicine as told by your doctor.  Do not take aspirin.  Keep all doctor visits as told.  If you or your partner have problems with grieving, talk to your doctor. You can also try counseling. Give yourself time to grieve before trying to get pregnant again. GET HELP RIGHT AWAY IF:  You have bad cramps or pain in your back or belly (abdomen).  You have a fever.  You pass large clumps of blood (clots) from your vagina that are walnut-sized or larger. Save the clumps for your doctor to see.  You pass large amounts of tissue from your vagina. Save the tissue for your doctor to see.  You have more bleeding.  You have thick, bad-smelling fluid (discharge) coming from the vagina.  You get lightheaded, weak, or you pass out (faint).  You have chills. MAKE SURE YOU:  Understand these instructions.  Will watch your condition.  Will get help right away if you are not doing well or get worse. Document Released: 10/21/2011 Document Reviewed: 10/21/2011 [facility] Patient Information 2015 [employer]. This information is not intended to replace advice given to you by your health care provider. Make sure you discuss any questions you have with your health care provider.

## 2015-02-10 ENCOUNTER — Encounter: Payer: Self-pay | Admitting: Medical

## 2015-02-10 ENCOUNTER — Ambulatory Visit (INDEPENDENT_AMBULATORY_CARE_PROVIDER_SITE_OTHER): Payer: Medicaid Other | Admitting: Medical

## 2015-02-10 VITALS — BP 147/74 | HR 85 | Temp 98.4°F | Wt 183.0 lb

## 2015-02-10 DIAGNOSIS — O039 Complete or unspecified spontaneous abortion without complication: Secondary | ICD-10-CM

## 2015-02-10 LAB — CBC
HEMATOCRIT: 40.3 % (ref 36.0–46.0)
Hemoglobin: 13.1 g/dL (ref 12.0–15.0)
MCH: 30.6 pg (ref 26.0–34.0)
MCHC: 32.5 g/dL (ref 30.0–36.0)
MCV: 94.2 fL (ref 78.0–100.0)
MPV: 9.9 fL (ref 8.6–12.4)
Platelets: 261 10*3/uL (ref 150–400)
RBC: 4.28 MIL/uL (ref 3.87–5.11)
RDW: 13.2 % (ref 11.5–15.5)
WBC: 4.5 10*3/uL (ref 4.0–10.5)

## 2015-02-10 NOTE — Progress Notes (Signed)
Patient ID: Katherine Day, female   DOB: [birth year], 39 y.o.   MRN: [id number]  History:  Katherine Day is a 39 y.o. [phone] who presents to clinic today for follow-up after SAB. The patient had complete SAB diagnosed on 01/26/15 in MAU. The patient states that bleeding stopped ~ 1 week ago. She denies bleeding today. She states continued mild, occasional lower abdominal cramping at midline. She states that this is generally worse in the morning. She has not taken anything for pain. She does not desire birth control at this time.     Patient Active Problem List   Diagnosis Date Noted  . Seasonal allergies 11/23/2012    No Known Allergies  Current Outpatient Prescriptions on File Prior to Visit  Medication Sig Dispense Refill  . Prenatal Vit-DSS-Fe Fum-FA (PRENATAL 19) 29-1 MG TABS Take 1 tablet by mouth daily. (Patient not taking: Reported on 02/10/2015) 30 tablet 6   No current facility-administered medications on file prior to visit.     The following portions of the patient's history were reviewed and updated as appropriate: allergies, current medications, family history, past medical history, social history, past surgical history and problem list.  Review of Systems:  Other than those mentioned in HPI all ROS negative  Objective:  Physical Exam BP 147/74 mmHg  Pulse 85  Temp(Src) 98.4 F (36.9 C) (Oral)  Wt 183 lb (83.008 kg)  LMP 11/21/2014  Breastfeeding? Unknown CONSTITUTIONAL: Well-developed, well-nourished female in no acute distress.  EYES: EOM intact, conjunctivae normal, no scleral icterus HEAD: Normocephalic, atraumatic ENT: External right and left ear normal, oropharynx is clear and moist. CARDIOVASCULAR: Normal heart rate noted. No cyanosis or edema. RESPIRATORY: Effort normal, no problems with respiration noted. GASTROINTESTINAL:Soft, no distention noted.  Mild tenderness noted to palpation of the midline pelvis, rebound or guarding.  MUSCULOSKELETAL:  Normal range of motion. No tenderness. SKIN: Skin is warm and dry. No rash noted. Not diaphoretic. No erythema. No pallor. Katherine Day: Alert and oriented to person, place, and time. Normal reflexes, muscle tone, coordination. No cranial nerve deficit noted. PSYCHIATRIC: Normal mood and affect. Normal behavior. Normal judgment and thought content.  Labs and Imaging Quant hCG and CBC today  Assessment & Plan:  Assessment: SAB, recent  Plans: CBC and quant hCG drawn today. Patient will be contacted with abnormal results Patient to return to [facility] as needed or if symptoms were to change or worsen  Luvenia Redden, PA-C 02/10/2015 9:48 AM

## 2015-02-10 NOTE — Patient Instructions (Signed)
Miscarriage A miscarriage is the loss of an unborn baby (fetus) before the 20th week of pregnancy. The cause is often unknown.  HOME CARE  You may need to stay in bed (bed rest), or you may be able to do light activity. Go about activity as told by your doctor.  Have help at home.  Write down how many pads you use each day. Write down how soaked they are.  Do not use tampons. Do not wash out your vagina (douche) or have sex (intercourse) until your doctor approves.  Only take medicine as told by your doctor.  Do not take aspirin.  Keep all doctor visits as told.  If you or your partner have problems with grieving, talk to your doctor. You can also try counseling. Give yourself time to grieve before trying to get pregnant again. GET HELP RIGHT AWAY IF:  You have bad cramps or pain in your back or belly (abdomen).  You have a fever.  You pass large clumps of blood (clots) from your vagina that are walnut-sized or larger. Save the clumps for your doctor to see.  You pass large amounts of tissue from your vagina. Save the tissue for your doctor to see.  You have more bleeding.  You have thick, bad-smelling fluid (discharge) coming from the vagina.  You get lightheaded, weak, or you pass out (faint).  You have chills. MAKE SURE YOU:  Understand these instructions.  Will watch your condition.  Will get help right away if you are not doing well or get worse. Document Released: 10/21/2011 Document Reviewed: 10/21/2011 [facility] Patient Information 2015 [employer]. This information is not intended to replace advice given to you by your health care provider. Make sure you discuss any questions you have with your health care provider.

## 2015-02-11 LAB — HCG, QUANTITATIVE, PREGNANCY: hCG, Beta Chain, Quant, S: <2 m[IU]/mL

## 2016-06-11 ENCOUNTER — Ambulatory Visit (INDEPENDENT_AMBULATORY_CARE_PROVIDER_SITE_OTHER): Payer: BLUE CROSS/BLUE SHIELD | Admitting: Physician Assistant

## 2016-06-11 VITALS — BP 128/80 | HR 102 | Temp 98.6°F | Resp 17 | Ht 63.0 in | Wt 179.0 lb

## 2016-06-11 DIAGNOSIS — N926 Irregular menstruation, unspecified: Secondary | ICD-10-CM | POA: Diagnosis not present

## 2016-06-11 LAB — POCT URINE PREGNANCY: PREG TEST UR: NEGATIVE

## 2016-06-11 NOTE — Patient Instructions (Addendum)
  I will contact you with your lab results as soon as they are available.   If you have not heard from me in 2 weeks, please contact me.  The fastest way to get your results is to register for My Chart (see the instructions on the last page of this printout).  OB/GYN in [facility] [place] [facility] [id number] [facility] - [phone]   IF you received an x-ray today, you will receive an invoice from [facility]. Please contact [facility] at [phone] with questions or concerns regarding your invoice.   IF you received labwork today, you will receive an invoice from [employer]. Please contact [facility] at [phone] with questions or concerns regarding your invoice.   Our billing staff will not be able to assist you with questions regarding bills from these companies.  You will be contacted with the lab results as soon as they are available. The fastest way to get your results is to activate your My Chart account. Instructions are located on the last page of this paperwork. If you have not heard from [place] regarding the results in 2 weeks, please contact this office.

## 2016-06-11 NOTE — Progress Notes (Signed)
   Katherine Day  MRN: [id number] DOB: 12-Jul-1976  Subjective:  Pt presents to clinic for pregnancy test.  LMP 9/27 - she is ok with being pregnant but she has not been actively trying.  She has taken a pregnancy test at home which was positive.  She "feels different" - some hot/cold spells for no reasons.  Review of Systems  Constitutional: Positive for chills (random). Negative for fever.  HENT: Negative.   Respiratory: Negative.   Gastrointestinal: Negative.   Genitourinary: Positive for menstrual problem (late). Negative for dysuria, frequency and vaginal discharge.    Patient Active Problem List   Diagnosis Date Noted  . Seasonal allergies 11/23/2012    No current outpatient prescriptions on file prior to visit.   No current facility-administered medications on file prior to visit.     No Known Allergies  Pt patients past, family and social history were reviewed and updated.  Objective:  BP 128/80 (BP Location: Right Arm, Patient Position: Sitting, Cuff Size: Normal)   Pulse (!) 102   Temp 98.6 F (37 C) (Oral)   Resp 17   Ht 5\' 3"  (1.6 m)   Wt 179 lb (81.2 kg)   LMP 05/08/2016 (Approximate)   SpO2 99%   BMI 31.71 kg/m   Physical Exam  Constitutional: She is oriented to person, place, and time and well-developed, well-nourished, and in no distress.  HENT:  Head: Normocephalic and atraumatic.  Right Ear: Hearing and external ear normal.  Left Ear: Hearing and external ear normal.  Eyes: Conjunctivae are normal.  Neck: Normal range of motion.  Cardiovascular: Normal rate, regular rhythm and normal heart sounds.   No murmur heard. Pulmonary/Chest: Effort normal and breath sounds normal. She has no wheezes.  Neurological: She is alert and oriented to person, place, and time. Gait normal.  Skin: Skin is warm and dry.  Psychiatric: Mood, memory, affect and judgment normal.  Vitals reviewed.  Results for orders placed or performed in visit on 06/11/16  POCT  urine pregnancy  Result Value Ref Range   Preg Test, Ur Negative Negative    Assessment and Plan :  Late menses - Plan: POCT urine pregnancy  Pt has had a home pregnancy test that was positive.  Her urine test here was negative so we will send out a quant HCG - she will start a PNV just in case.  Her questions were answered and she agreed with the plan.  Windell Hummingbird PA-C  [facility] 06/11/2016 11:29 AM

## 2016-06-12 LAB — HCG, QUANTITATIVE, PREGNANCY: hCG, Beta Chain, Quant, S: <2 m[IU]/mL

## 2016-12-25 ENCOUNTER — Encounter: Payer: Self-pay | Admitting: Gynecology

## 2017-04-21 IMAGING — US US OB TRANSVAGINAL
1 series · 13 of 28 positions shown · non-contrast
Comparison: None.

CLINICAL DATA: Heavy vaginal bleeding. Gestational age by LMP of 8
weeks 3 days.

EXAM:
OBSTETRIC <14 WK US AND TRANSVAGINAL OB US
TECHNIQUE: Both transabdominal and transvaginal ultrasound examinations were
performed for complete evaluation of the gestation as well as the
maternal uterus, adnexal regions, and pelvic cul-de-sac.
Transvaginal technique was performed to assess early pregnancy.

[Series 1: us ob comp less 14 wk · 52 acquisitions, 13 frames shown]
[im 2/52]
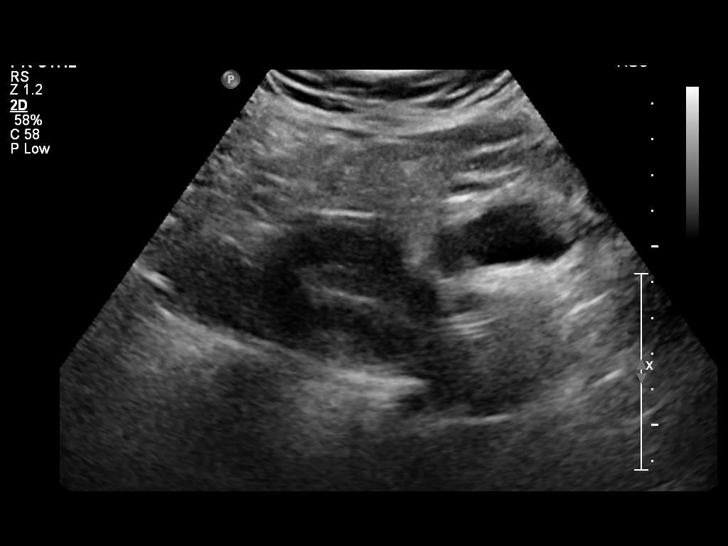
[im 6/52]
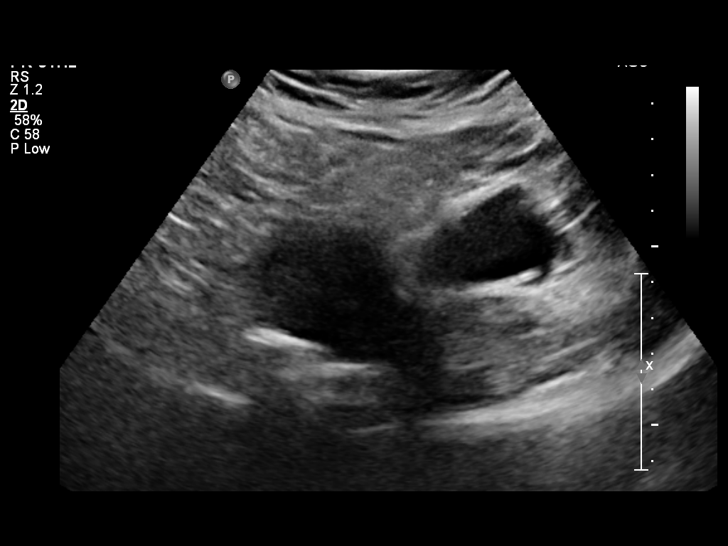
[im 10/52]
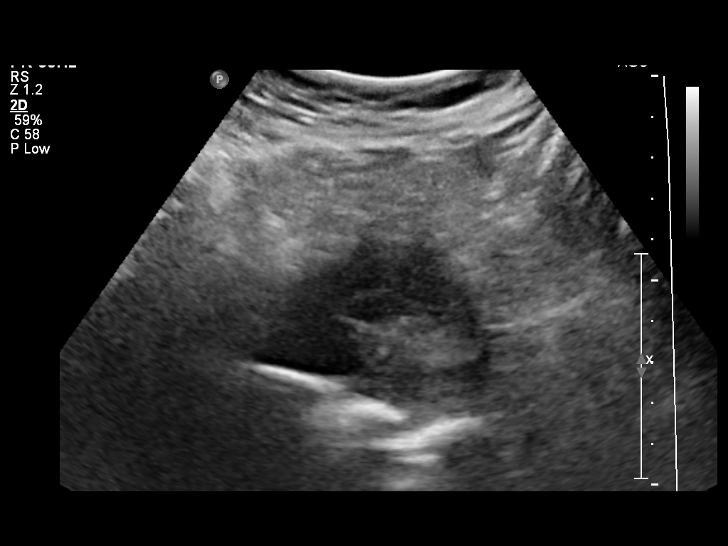
[im 14/52]
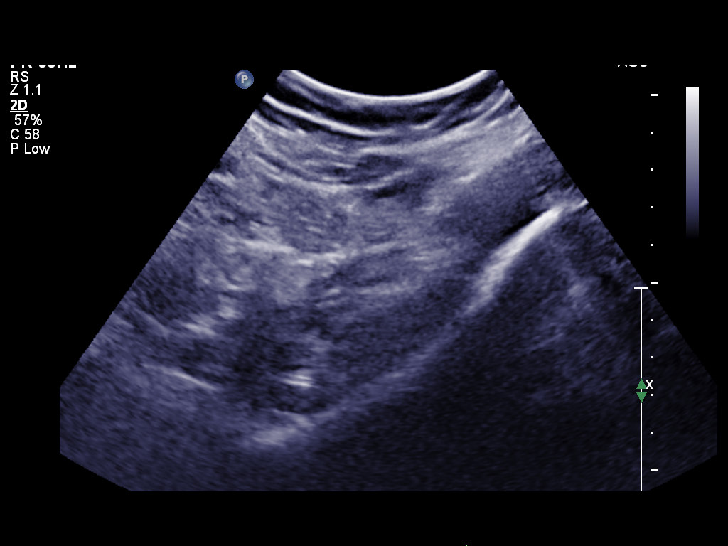
[im 18/52]
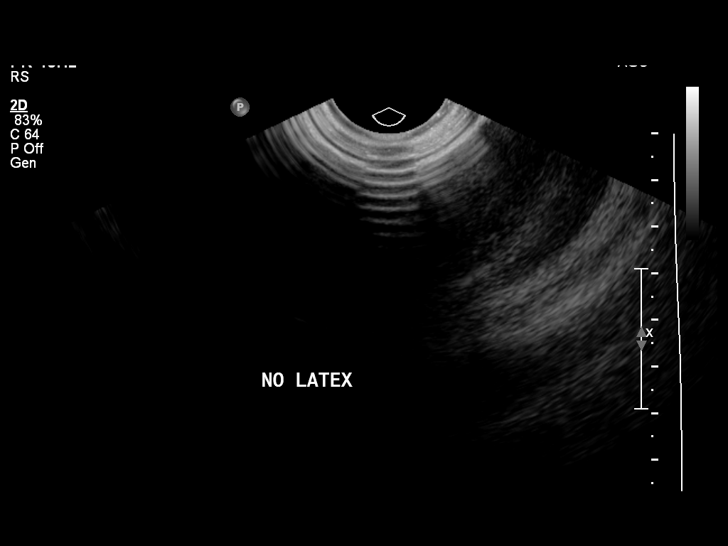
[im 21/52]
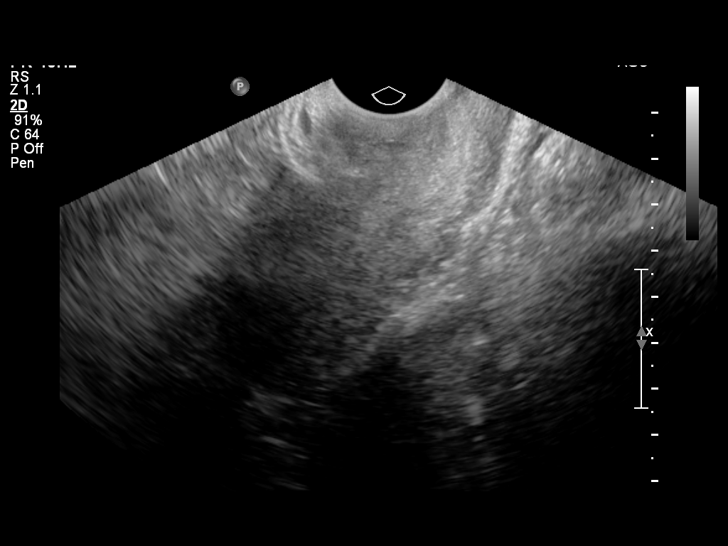
[im 27/52]
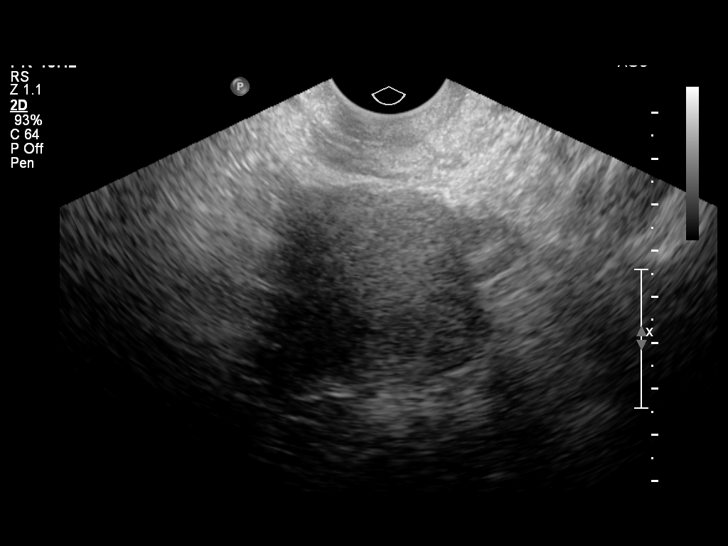
[im 31/52]
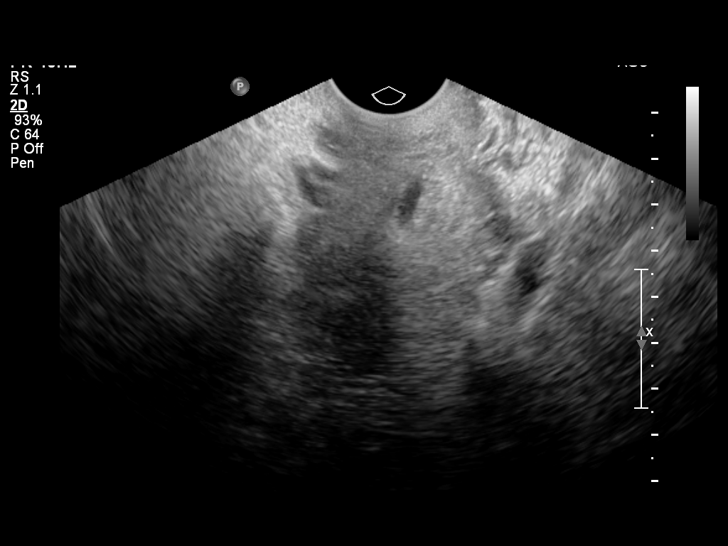
[im 35/52]
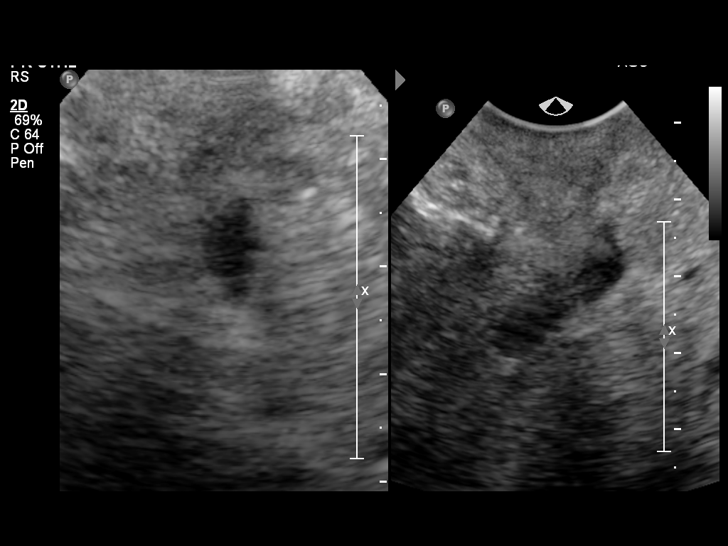
[im 38/52]
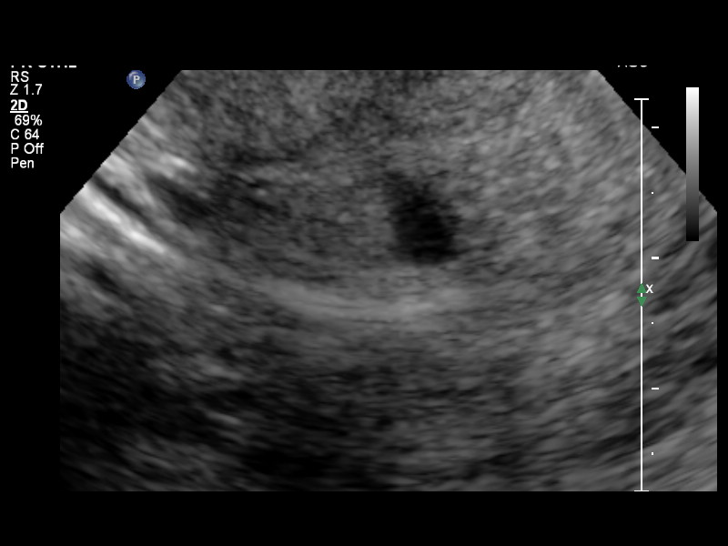
[im 42/52]
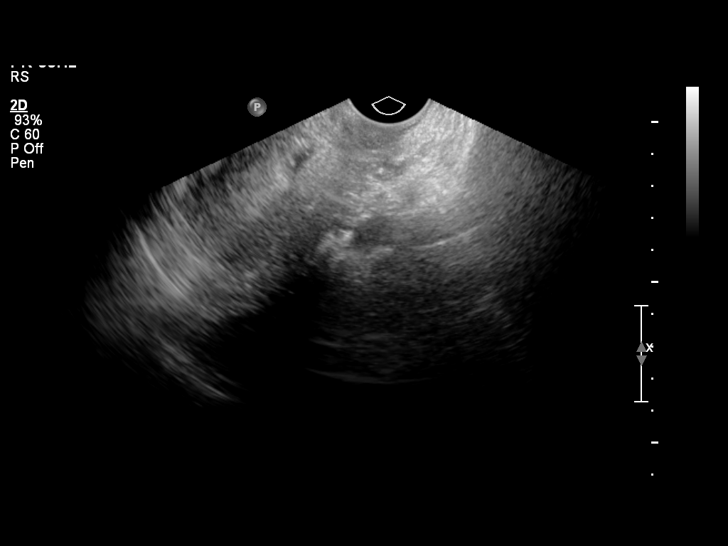
[im 46/52]
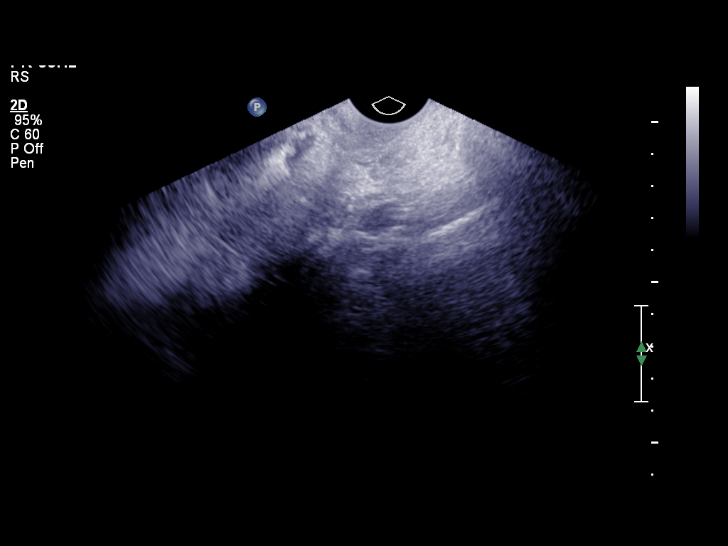
[im 50/52]
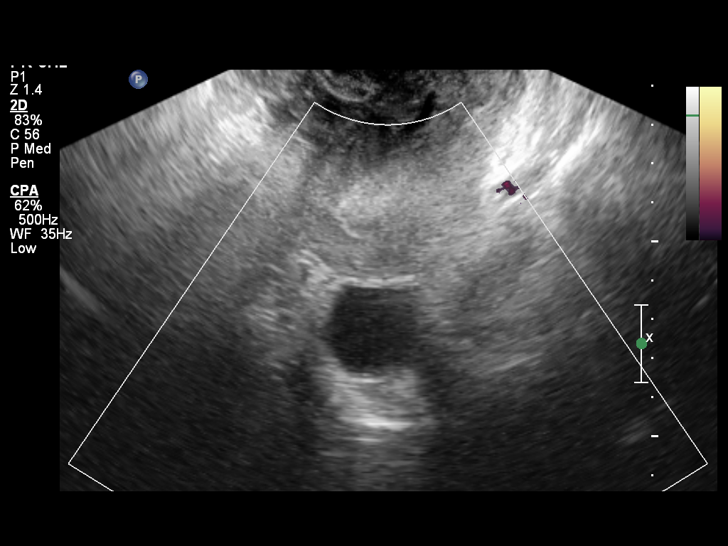

[13 of 28 positions shown; findings below may reference images not displayed]

FINDINGS: Intrauterine gestational sac: Single, with irregular shape in lower
uterine segment

Yolk sac:  Visualized

Embryo:  Not visualized

MSD: 11  mm   5 w   6  d

Maternal uterus/adnexae: Neither ovary well visualized on this exam.
A 3.0 cm simple appearing cyst is seen in the left adnexa, with
differential diagnosis including corpus luteum cyst or benign
paraovarian cyst. No suspicious adnexal masses or free fluid
identified.
IMPRESSION: Single irregular gestational sac in lower uterine segment. Findings
are suspicious but not yet definitive for failed pregnancy.
Recommend follow-up US in 10-14 days for definitive diagnosis. This
recommendation follows SRU consensus guidelines: Diagnostic Criteria
for Nonviable Pregnancy Early in the First Trimester. N Engl J Med

## 2017-04-28 IMAGING — US US OB TRANSVAGINAL
1 series · 14 of 23 positions shown · non-contrast
Comparison: 01/19/2015

CLINICAL DATA: Pregnant, vaginal bleeding/spotting

EXAM:
TRANSVAGINAL OB ULTRASOUND
TECHNIQUE: Transvaginal ultrasound was performed for complete evaluation of the
gestation as well as the maternal uterus, adnexal regions, and
pelvic cul-de-sac.

[Series 1: us ob transvaginal · 14 of 23 slices shown]
[im 1/23]
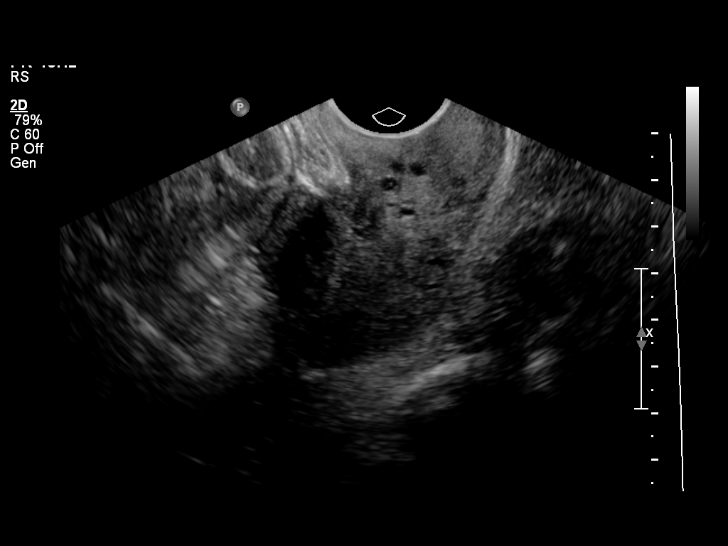
[im 3/23]
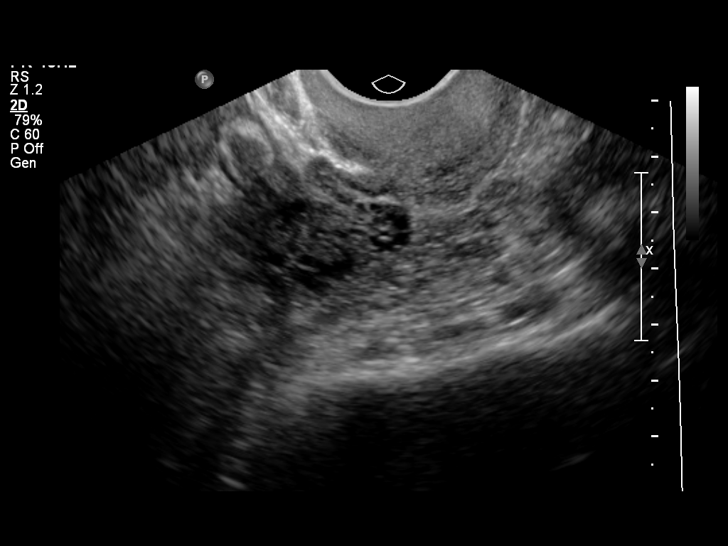
[im 5/23]
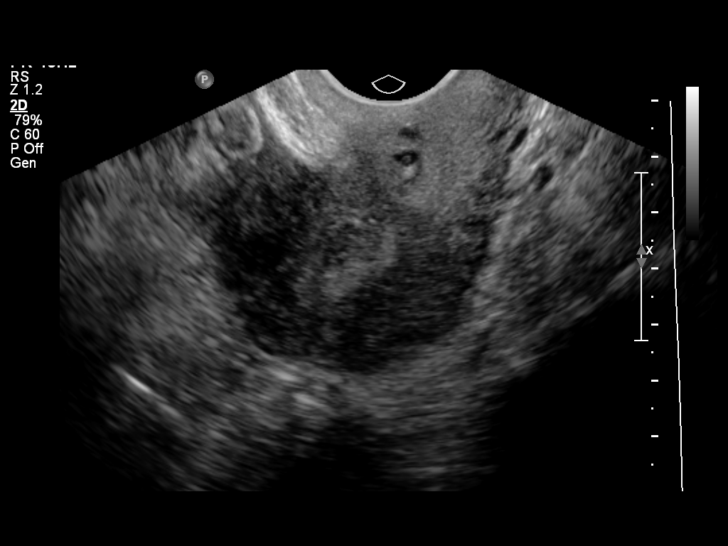
[im 6/23]
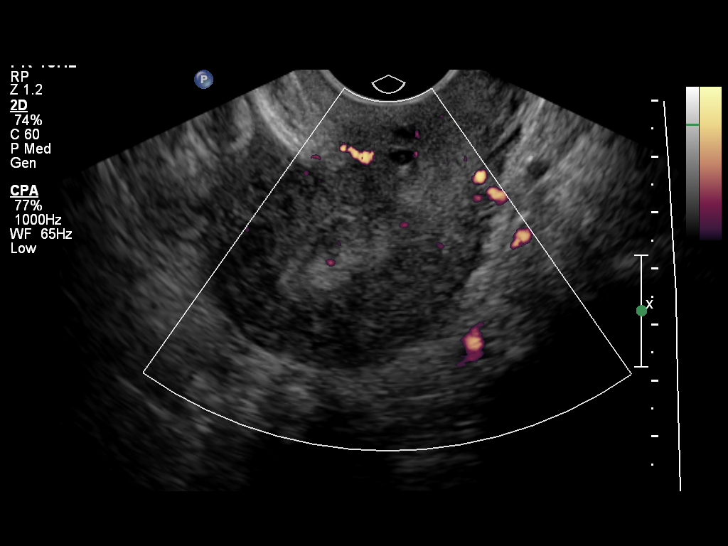
[im 8/23]
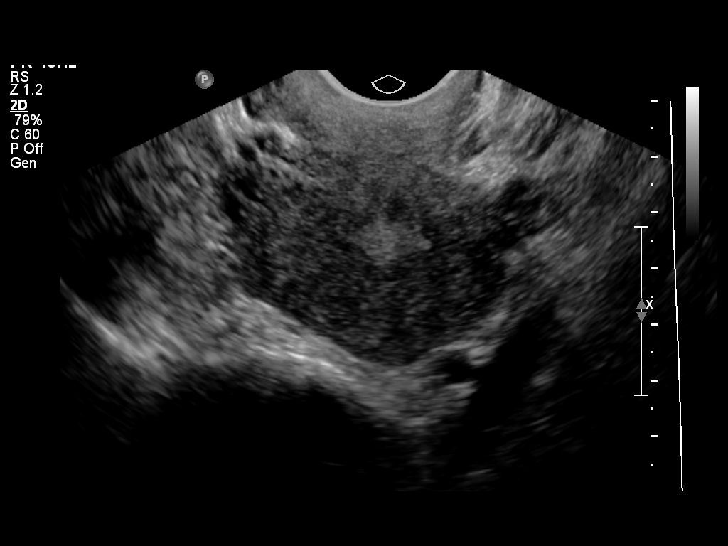
[im 10/23]
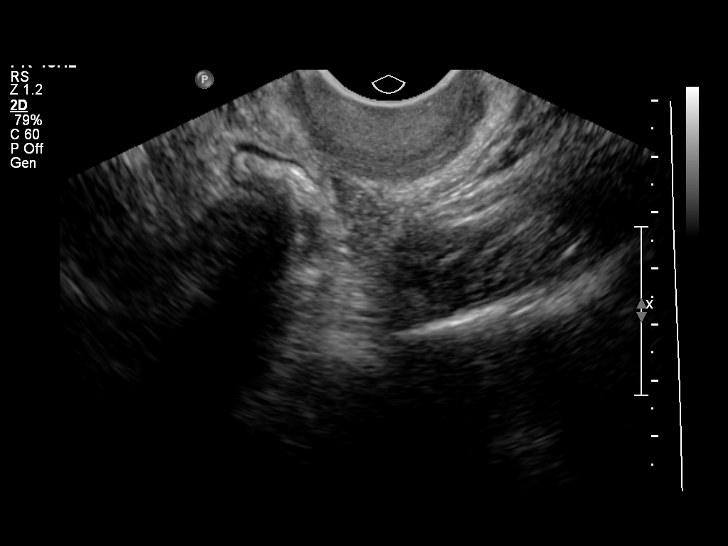
[im 11/23]
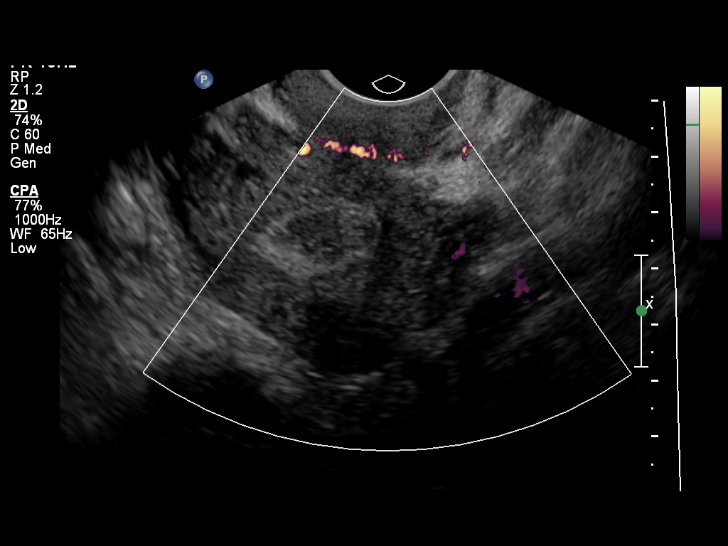
[im 13/23]
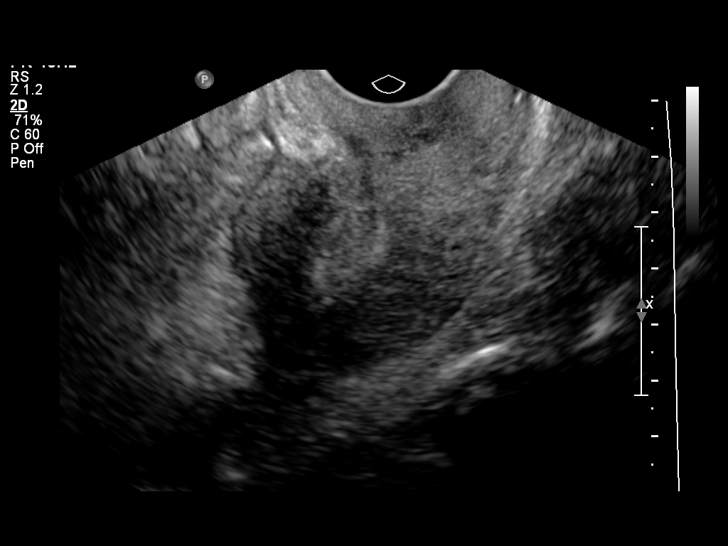
[im 14/23]
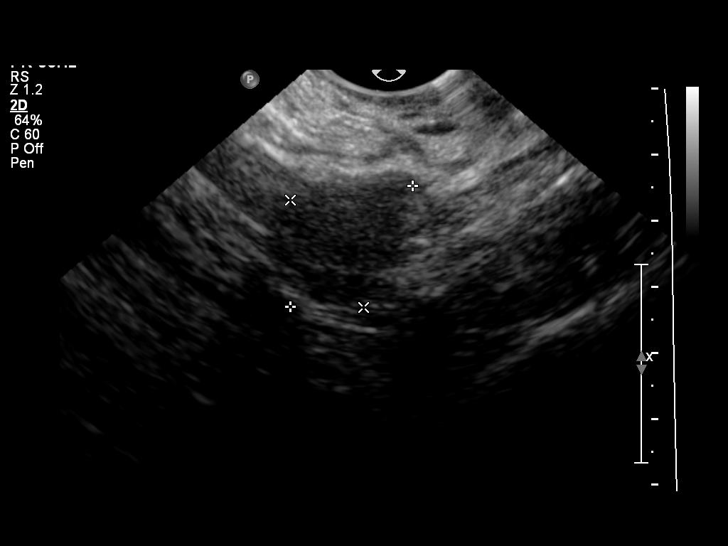
[im 16/23]
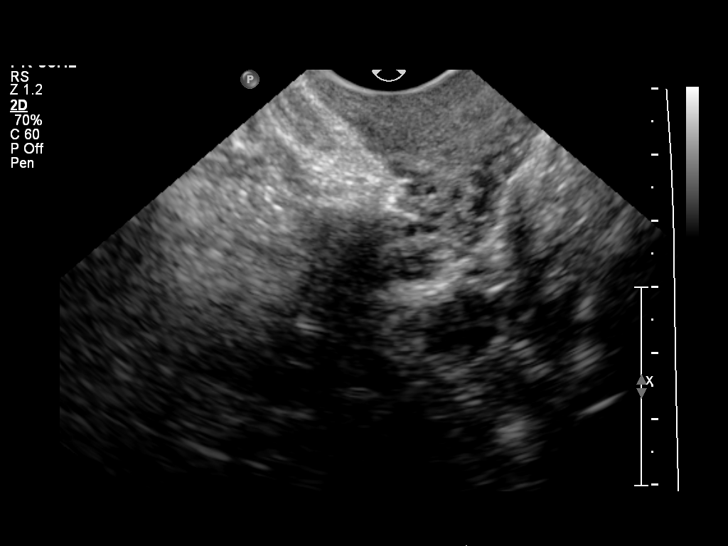
[im 18/23]
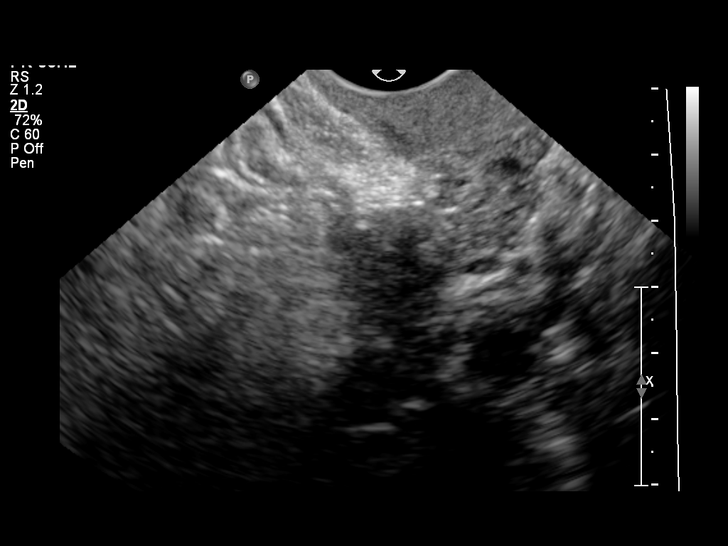
[im 19/23]
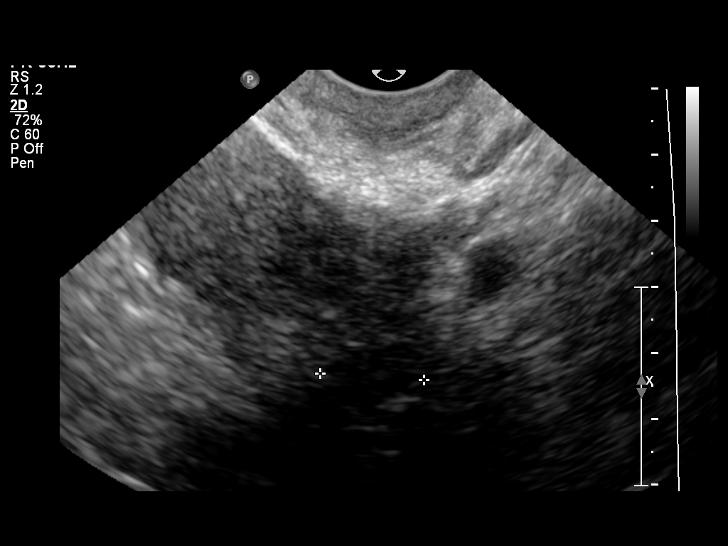
[im 21/23]
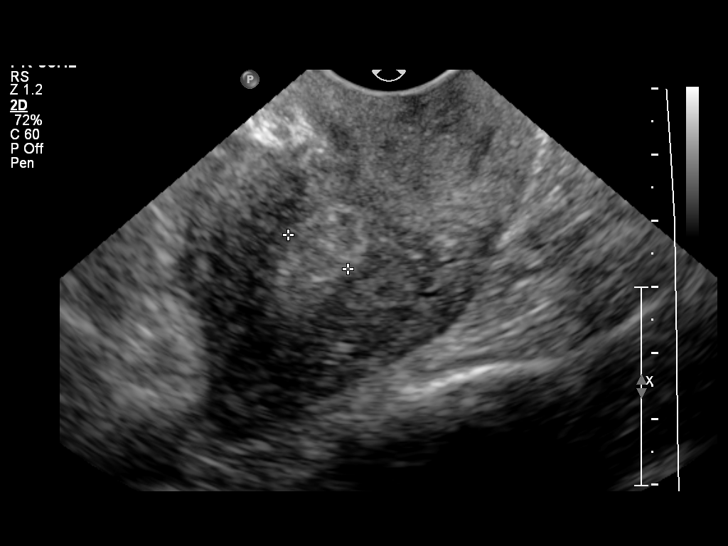
[im 23/23]
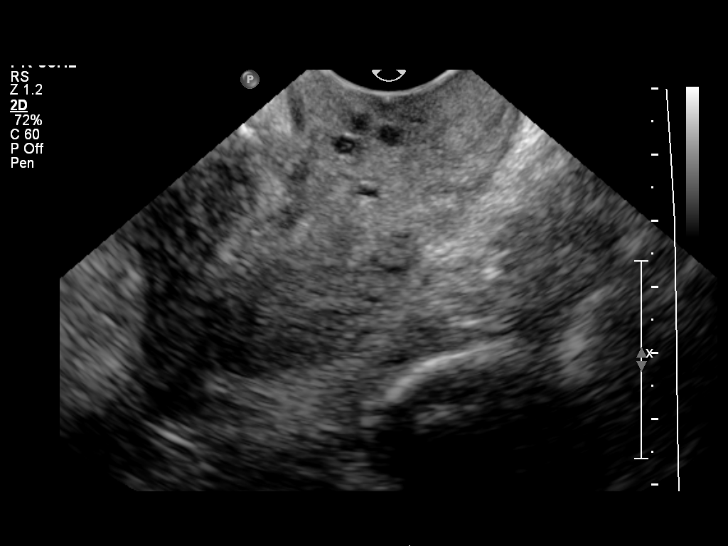

[14 of 23 positions shown; findings below may reference images not displayed]

FINDINGS: Intrauterine gestational sac: No IUP is visualized.

Maternal uterus/adnexae: Endometrial complex is mildly
heterogeneous, measuring 10 mm, without focal color Doppler flow.
This appearance is indeterminate but favored to reflect blood
products/debris.

Bilateral ovaries are within normal limits.

No free fluid.
IMPRESSION: No IUP is visualized, compatible with missed abortion.

Endometrial complex is mildly heterogeneous and measures 10 mm,
indeterminate but favored to reflect blood products/debris. No color
Doppler flow to suggest vascularized retained products of
conception. Correlate with beta HCG.
